# Patient Record
Sex: Female | Born: 1935
Health system: Southern US, Community
[De-identification: ages and names within clinical notes are randomized; demographics above are authoritative.]

## PROBLEM LIST (undated history)

## (undated) DIAGNOSIS — I2089 Other forms of angina pectoris: Secondary | ICD-10-CM

## (undated) DIAGNOSIS — K2100 Gastro-esophageal reflux disease with esophagitis, without bleeding: Secondary | ICD-10-CM

## (undated) DIAGNOSIS — I208 Other forms of angina pectoris: Secondary | ICD-10-CM

## (undated) DIAGNOSIS — B059 Measles without complication: Secondary | ICD-10-CM

## (undated) DIAGNOSIS — E785 Hyperlipidemia, unspecified: Secondary | ICD-10-CM

## (undated) DIAGNOSIS — I10 Essential (primary) hypertension: Secondary | ICD-10-CM

## (undated) HISTORY — DX: Essential (primary) hypertension: I10

## (undated) HISTORY — DX: Measles without complication: B05.9

## (undated) HISTORY — DX: Gastro-esophageal reflux disease with esophagitis, without bleeding: K21.00

## (undated) HISTORY — DX: Other forms of angina pectoris: I20.89

## (undated) HISTORY — PX: LAPAROSCOPIC TOTAL HYSTERECTOMY: SUR800

## (undated) HISTORY — DX: Other forms of angina pectoris: I20.8

## (undated) HISTORY — DX: Hyperlipidemia, unspecified: E78.5

## (undated) HISTORY — DX: Gastro-esophageal reflux disease with esophagitis: K21.0

---

## 2005-02-15 HISTORY — PX: DIAGNOSTIC MAMMOGRAM: HXRAD719

## 2007-01-02 ENCOUNTER — Ambulatory Visit: Payer: Self-pay | Admitting: Cardiology

## 2009-04-08 ENCOUNTER — Ambulatory Visit: Payer: Self-pay | Admitting: Cardiology

## 2012-08-15 HISTORY — PX: COLONOSCOPY: SHX174

## 2015-02-16 HISTORY — PX: OTHER SURGICAL HISTORY: SHX169

## 2015-03-17 DIAGNOSIS — Z888 Allergy status to other drugs, medicaments and biological substances status: Secondary | ICD-10-CM | POA: Diagnosis not present

## 2015-03-17 DIAGNOSIS — Z87440 Personal history of urinary (tract) infections: Secondary | ICD-10-CM | POA: Diagnosis not present

## 2015-03-17 DIAGNOSIS — Z86010 Personal history of colonic polyps: Secondary | ICD-10-CM | POA: Diagnosis not present

## 2015-03-17 DIAGNOSIS — I10 Essential (primary) hypertension: Secondary | ICD-10-CM | POA: Diagnosis not present

## 2015-03-17 DIAGNOSIS — D62 Acute posthemorrhagic anemia: Secondary | ICD-10-CM | POA: Diagnosis not present

## 2015-03-17 DIAGNOSIS — E7800 Pure hypercholesterolemia, unspecified: Secondary | ICD-10-CM | POA: Diagnosis not present

## 2015-03-17 DIAGNOSIS — Z90710 Acquired absence of both cervix and uterus: Secondary | ICD-10-CM | POA: Diagnosis not present

## 2015-03-17 DIAGNOSIS — Z7982 Long term (current) use of aspirin: Secondary | ICD-10-CM | POA: Diagnosis not present

## 2015-03-17 DIAGNOSIS — M1990 Unspecified osteoarthritis, unspecified site: Secondary | ICD-10-CM | POA: Diagnosis not present

## 2015-03-17 DIAGNOSIS — R350 Frequency of micturition: Secondary | ICD-10-CM | POA: Diagnosis not present

## 2015-03-17 DIAGNOSIS — N8111 Cystocele, midline: Secondary | ICD-10-CM | POA: Diagnosis not present

## 2015-03-17 DIAGNOSIS — Z79899 Other long term (current) drug therapy: Secondary | ICD-10-CM | POA: Diagnosis not present

## 2015-03-17 DIAGNOSIS — M8580 Other specified disorders of bone density and structure, unspecified site: Secondary | ICD-10-CM | POA: Diagnosis not present

## 2015-03-18 DIAGNOSIS — I10 Essential (primary) hypertension: Secondary | ICD-10-CM | POA: Diagnosis not present

## 2015-03-18 DIAGNOSIS — E7800 Pure hypercholesterolemia, unspecified: Secondary | ICD-10-CM | POA: Diagnosis not present

## 2015-03-18 DIAGNOSIS — Z90710 Acquired absence of both cervix and uterus: Secondary | ICD-10-CM | POA: Diagnosis not present

## 2015-03-18 DIAGNOSIS — D62 Acute posthemorrhagic anemia: Secondary | ICD-10-CM | POA: Diagnosis not present

## 2015-03-18 DIAGNOSIS — R350 Frequency of micturition: Secondary | ICD-10-CM | POA: Diagnosis not present

## 2015-03-18 DIAGNOSIS — Z888 Allergy status to other drugs, medicaments and biological substances status: Secondary | ICD-10-CM | POA: Diagnosis not present

## 2015-03-18 DIAGNOSIS — Z87440 Personal history of urinary (tract) infections: Secondary | ICD-10-CM | POA: Diagnosis not present

## 2015-03-18 DIAGNOSIS — N8111 Cystocele, midline: Secondary | ICD-10-CM | POA: Diagnosis not present

## 2015-03-18 DIAGNOSIS — N8110 Cystocele, unspecified: Secondary | ICD-10-CM | POA: Diagnosis not present

## 2015-03-18 DIAGNOSIS — Z79899 Other long term (current) drug therapy: Secondary | ICD-10-CM | POA: Diagnosis not present

## 2015-03-18 DIAGNOSIS — Z7982 Long term (current) use of aspirin: Secondary | ICD-10-CM | POA: Diagnosis not present

## 2015-03-18 DIAGNOSIS — M1990 Unspecified osteoarthritis, unspecified site: Secondary | ICD-10-CM | POA: Diagnosis not present

## 2015-03-18 DIAGNOSIS — M8580 Other specified disorders of bone density and structure, unspecified site: Secondary | ICD-10-CM | POA: Diagnosis not present

## 2015-03-18 DIAGNOSIS — Z86010 Personal history of colonic polyps: Secondary | ICD-10-CM | POA: Diagnosis not present

## 2015-03-19 DIAGNOSIS — Z79899 Other long term (current) drug therapy: Secondary | ICD-10-CM | POA: Diagnosis not present

## 2015-03-19 DIAGNOSIS — I10 Essential (primary) hypertension: Secondary | ICD-10-CM | POA: Diagnosis not present

## 2015-03-19 DIAGNOSIS — Z86010 Personal history of colonic polyps: Secondary | ICD-10-CM | POA: Diagnosis not present

## 2015-03-19 DIAGNOSIS — M8580 Other specified disorders of bone density and structure, unspecified site: Secondary | ICD-10-CM | POA: Diagnosis not present

## 2015-03-19 DIAGNOSIS — Z888 Allergy status to other drugs, medicaments and biological substances status: Secondary | ICD-10-CM | POA: Diagnosis not present

## 2015-03-19 DIAGNOSIS — M1990 Unspecified osteoarthritis, unspecified site: Secondary | ICD-10-CM | POA: Diagnosis not present

## 2015-03-19 DIAGNOSIS — Z7982 Long term (current) use of aspirin: Secondary | ICD-10-CM | POA: Diagnosis not present

## 2015-03-19 DIAGNOSIS — R350 Frequency of micturition: Secondary | ICD-10-CM | POA: Diagnosis not present

## 2015-03-19 DIAGNOSIS — E7800 Pure hypercholesterolemia, unspecified: Secondary | ICD-10-CM | POA: Diagnosis not present

## 2015-03-19 DIAGNOSIS — D62 Acute posthemorrhagic anemia: Secondary | ICD-10-CM | POA: Diagnosis not present

## 2015-03-19 DIAGNOSIS — Z87440 Personal history of urinary (tract) infections: Secondary | ICD-10-CM | POA: Diagnosis not present

## 2015-03-19 DIAGNOSIS — N8111 Cystocele, midline: Secondary | ICD-10-CM | POA: Diagnosis not present

## 2015-03-27 DIAGNOSIS — Z7982 Long term (current) use of aspirin: Secondary | ICD-10-CM | POA: Diagnosis not present

## 2015-03-27 DIAGNOSIS — M810 Age-related osteoporosis without current pathological fracture: Secondary | ICD-10-CM | POA: Diagnosis not present

## 2015-03-27 DIAGNOSIS — E7800 Pure hypercholesterolemia, unspecified: Secondary | ICD-10-CM | POA: Diagnosis not present

## 2015-03-27 DIAGNOSIS — Z13820 Encounter for screening for osteoporosis: Secondary | ICD-10-CM | POA: Diagnosis not present

## 2015-03-27 DIAGNOSIS — Z780 Asymptomatic menopausal state: Secondary | ICD-10-CM | POA: Diagnosis not present

## 2015-03-27 DIAGNOSIS — I10 Essential (primary) hypertension: Secondary | ICD-10-CM | POA: Diagnosis not present

## 2015-03-27 DIAGNOSIS — Z79899 Other long term (current) drug therapy: Secondary | ICD-10-CM | POA: Diagnosis not present

## 2015-05-13 DIAGNOSIS — Z0000 Encounter for general adult medical examination without abnormal findings: Secondary | ICD-10-CM | POA: Diagnosis not present

## 2015-05-13 DIAGNOSIS — I10 Essential (primary) hypertension: Secondary | ICD-10-CM | POA: Diagnosis not present

## 2015-05-30 DIAGNOSIS — R05 Cough: Secondary | ICD-10-CM | POA: Diagnosis not present

## 2015-05-30 DIAGNOSIS — M1990 Unspecified osteoarthritis, unspecified site: Secondary | ICD-10-CM | POA: Diagnosis not present

## 2015-05-30 DIAGNOSIS — Z7982 Long term (current) use of aspirin: Secondary | ICD-10-CM | POA: Diagnosis not present

## 2015-05-30 DIAGNOSIS — R6884 Jaw pain: Secondary | ICD-10-CM | POA: Diagnosis not present

## 2015-05-30 DIAGNOSIS — Z8249 Family history of ischemic heart disease and other diseases of the circulatory system: Secondary | ICD-10-CM | POA: Diagnosis not present

## 2015-05-30 DIAGNOSIS — I10 Essential (primary) hypertension: Secondary | ICD-10-CM | POA: Diagnosis not present

## 2015-05-30 DIAGNOSIS — J069 Acute upper respiratory infection, unspecified: Secondary | ICD-10-CM | POA: Diagnosis not present

## 2015-05-30 DIAGNOSIS — Z79899 Other long term (current) drug therapy: Secondary | ICD-10-CM | POA: Diagnosis not present

## 2015-07-10 DIAGNOSIS — H52223 Regular astigmatism, bilateral: Secondary | ICD-10-CM | POA: Diagnosis not present

## 2015-07-10 DIAGNOSIS — H5203 Hypermetropia, bilateral: Secondary | ICD-10-CM | POA: Diagnosis not present

## 2015-07-10 DIAGNOSIS — H25813 Combined forms of age-related cataract, bilateral: Secondary | ICD-10-CM | POA: Diagnosis not present

## 2015-07-10 DIAGNOSIS — H524 Presbyopia: Secondary | ICD-10-CM | POA: Diagnosis not present

## 2015-07-21 DIAGNOSIS — R3915 Urgency of urination: Secondary | ICD-10-CM | POA: Diagnosis not present

## 2015-07-21 DIAGNOSIS — N8110 Cystocele, unspecified: Secondary | ICD-10-CM | POA: Diagnosis not present

## 2015-08-14 DIAGNOSIS — M818 Other osteoporosis without current pathological fracture: Secondary | ICD-10-CM | POA: Diagnosis not present

## 2015-08-14 DIAGNOSIS — Z1389 Encounter for screening for other disorder: Secondary | ICD-10-CM | POA: Diagnosis not present

## 2015-08-14 DIAGNOSIS — I10 Essential (primary) hypertension: Secondary | ICD-10-CM | POA: Diagnosis not present

## 2015-08-14 DIAGNOSIS — E1165 Type 2 diabetes mellitus with hyperglycemia: Secondary | ICD-10-CM | POA: Diagnosis not present

## 2015-08-14 DIAGNOSIS — Z0000 Encounter for general adult medical examination without abnormal findings: Secondary | ICD-10-CM | POA: Diagnosis not present

## 2015-10-14 DIAGNOSIS — I10 Essential (primary) hypertension: Secondary | ICD-10-CM | POA: Diagnosis not present

## 2015-10-14 DIAGNOSIS — M818 Other osteoporosis without current pathological fracture: Secondary | ICD-10-CM | POA: Diagnosis not present

## 2015-10-14 DIAGNOSIS — E1165 Type 2 diabetes mellitus with hyperglycemia: Secondary | ICD-10-CM | POA: Diagnosis not present

## 2015-11-14 DIAGNOSIS — I10 Essential (primary) hypertension: Secondary | ICD-10-CM | POA: Diagnosis not present

## 2015-11-14 DIAGNOSIS — E1165 Type 2 diabetes mellitus with hyperglycemia: Secondary | ICD-10-CM | POA: Diagnosis not present

## 2015-11-14 DIAGNOSIS — M818 Other osteoporosis without current pathological fracture: Secondary | ICD-10-CM | POA: Diagnosis not present

## 2015-11-19 DIAGNOSIS — E1165 Type 2 diabetes mellitus with hyperglycemia: Secondary | ICD-10-CM | POA: Diagnosis not present

## 2015-11-19 DIAGNOSIS — I10 Essential (primary) hypertension: Secondary | ICD-10-CM | POA: Diagnosis not present

## 2015-11-28 DIAGNOSIS — E119 Type 2 diabetes mellitus without complications: Secondary | ICD-10-CM | POA: Diagnosis not present

## 2015-11-28 DIAGNOSIS — I10 Essential (primary) hypertension: Secondary | ICD-10-CM | POA: Diagnosis not present

## 2015-11-28 DIAGNOSIS — E785 Hyperlipidemia, unspecified: Secondary | ICD-10-CM | POA: Diagnosis not present

## 2015-11-28 DIAGNOSIS — Z7982 Long term (current) use of aspirin: Secondary | ICD-10-CM | POA: Diagnosis not present

## 2015-11-28 DIAGNOSIS — Z1211 Encounter for screening for malignant neoplasm of colon: Secondary | ICD-10-CM | POA: Diagnosis not present

## 2015-11-28 DIAGNOSIS — Z86010 Personal history of colonic polyps: Secondary | ICD-10-CM | POA: Diagnosis not present

## 2015-11-28 DIAGNOSIS — Z888 Allergy status to other drugs, medicaments and biological substances status: Secondary | ICD-10-CM | POA: Diagnosis not present

## 2015-11-28 DIAGNOSIS — Z87440 Personal history of urinary (tract) infections: Secondary | ICD-10-CM | POA: Diagnosis not present

## 2015-11-28 DIAGNOSIS — Z7984 Long term (current) use of oral hypoglycemic drugs: Secondary | ICD-10-CM | POA: Diagnosis not present

## 2015-11-28 DIAGNOSIS — M1990 Unspecified osteoarthritis, unspecified site: Secondary | ICD-10-CM | POA: Diagnosis not present

## 2015-11-28 DIAGNOSIS — Z79899 Other long term (current) drug therapy: Secondary | ICD-10-CM | POA: Diagnosis not present

## 2015-12-18 DIAGNOSIS — I10 Essential (primary) hypertension: Secondary | ICD-10-CM | POA: Diagnosis not present

## 2015-12-18 DIAGNOSIS — E1165 Type 2 diabetes mellitus with hyperglycemia: Secondary | ICD-10-CM | POA: Diagnosis not present

## 2015-12-18 DIAGNOSIS — Z23 Encounter for immunization: Secondary | ICD-10-CM | POA: Diagnosis not present

## 2016-01-16 DIAGNOSIS — M818 Other osteoporosis without current pathological fracture: Secondary | ICD-10-CM | POA: Diagnosis not present

## 2016-01-16 DIAGNOSIS — I10 Essential (primary) hypertension: Secondary | ICD-10-CM | POA: Diagnosis not present

## 2016-01-16 DIAGNOSIS — E1165 Type 2 diabetes mellitus with hyperglycemia: Secondary | ICD-10-CM | POA: Diagnosis not present

## 2016-01-16 DIAGNOSIS — J3089 Other allergic rhinitis: Secondary | ICD-10-CM | POA: Diagnosis not present

## 2016-02-25 DIAGNOSIS — I10 Essential (primary) hypertension: Secondary | ICD-10-CM | POA: Diagnosis not present

## 2016-02-25 DIAGNOSIS — E1165 Type 2 diabetes mellitus with hyperglycemia: Secondary | ICD-10-CM | POA: Diagnosis not present

## 2016-04-16 DIAGNOSIS — Z6829 Body mass index (BMI) 29.0-29.9, adult: Secondary | ICD-10-CM | POA: Diagnosis not present

## 2016-04-16 DIAGNOSIS — I10 Essential (primary) hypertension: Secondary | ICD-10-CM | POA: Diagnosis not present

## 2016-04-16 DIAGNOSIS — E1165 Type 2 diabetes mellitus with hyperglycemia: Secondary | ICD-10-CM | POA: Diagnosis not present

## 2016-04-16 DIAGNOSIS — J3089 Other allergic rhinitis: Secondary | ICD-10-CM | POA: Diagnosis not present

## 2016-04-16 DIAGNOSIS — M818 Other osteoporosis without current pathological fracture: Secondary | ICD-10-CM | POA: Diagnosis not present

## 2016-05-19 DIAGNOSIS — E119 Type 2 diabetes mellitus without complications: Secondary | ICD-10-CM | POA: Diagnosis not present

## 2016-05-19 DIAGNOSIS — E7800 Pure hypercholesterolemia, unspecified: Secondary | ICD-10-CM | POA: Diagnosis not present

## 2016-05-19 DIAGNOSIS — R079 Chest pain, unspecified: Secondary | ICD-10-CM | POA: Diagnosis not present

## 2016-05-19 DIAGNOSIS — Z79899 Other long term (current) drug therapy: Secondary | ICD-10-CM | POA: Diagnosis not present

## 2016-05-19 DIAGNOSIS — Z7982 Long term (current) use of aspirin: Secondary | ICD-10-CM | POA: Diagnosis not present

## 2016-05-19 DIAGNOSIS — I10 Essential (primary) hypertension: Secondary | ICD-10-CM | POA: Diagnosis not present

## 2016-05-19 DIAGNOSIS — Z7984 Long term (current) use of oral hypoglycemic drugs: Secondary | ICD-10-CM | POA: Diagnosis not present

## 2016-05-19 DIAGNOSIS — R0789 Other chest pain: Secondary | ICD-10-CM | POA: Diagnosis not present

## 2016-05-21 DIAGNOSIS — I208 Other forms of angina pectoris: Secondary | ICD-10-CM | POA: Diagnosis not present

## 2016-05-21 DIAGNOSIS — Z6829 Body mass index (BMI) 29.0-29.9, adult: Secondary | ICD-10-CM | POA: Diagnosis not present

## 2016-05-21 DIAGNOSIS — K210 Gastro-esophageal reflux disease with esophagitis: Secondary | ICD-10-CM | POA: Diagnosis not present

## 2016-05-27 DIAGNOSIS — I208 Other forms of angina pectoris: Secondary | ICD-10-CM | POA: Diagnosis not present

## 2016-06-03 DIAGNOSIS — M19019 Primary osteoarthritis, unspecified shoulder: Secondary | ICD-10-CM | POA: Diagnosis not present

## 2016-06-03 DIAGNOSIS — I10 Essential (primary) hypertension: Secondary | ICD-10-CM | POA: Diagnosis not present

## 2016-06-03 DIAGNOSIS — Z7982 Long term (current) use of aspirin: Secondary | ICD-10-CM | POA: Diagnosis not present

## 2016-06-03 DIAGNOSIS — K219 Gastro-esophageal reflux disease without esophagitis: Secondary | ICD-10-CM | POA: Diagnosis not present

## 2016-06-03 DIAGNOSIS — Z7984 Long term (current) use of oral hypoglycemic drugs: Secondary | ICD-10-CM | POA: Diagnosis not present

## 2016-06-03 DIAGNOSIS — Z6830 Body mass index (BMI) 30.0-30.9, adult: Secondary | ICD-10-CM | POA: Diagnosis not present

## 2016-06-03 DIAGNOSIS — E785 Hyperlipidemia, unspecified: Secondary | ICD-10-CM | POA: Diagnosis not present

## 2016-06-03 DIAGNOSIS — R32 Unspecified urinary incontinence: Secondary | ICD-10-CM | POA: Diagnosis not present

## 2016-06-03 DIAGNOSIS — E119 Type 2 diabetes mellitus without complications: Secondary | ICD-10-CM | POA: Diagnosis not present

## 2016-06-03 DIAGNOSIS — Z0000 Encounter for general adult medical examination without abnormal findings: Secondary | ICD-10-CM | POA: Diagnosis not present

## 2016-06-09 DIAGNOSIS — I10 Essential (primary) hypertension: Secondary | ICD-10-CM | POA: Diagnosis not present

## 2016-06-09 DIAGNOSIS — E1165 Type 2 diabetes mellitus with hyperglycemia: Secondary | ICD-10-CM | POA: Diagnosis not present

## 2016-06-18 ENCOUNTER — Encounter: Payer: Self-pay | Admitting: *Deleted

## 2016-06-21 ENCOUNTER — Telehealth: Payer: Self-pay | Admitting: Cardiology

## 2016-06-21 ENCOUNTER — Encounter: Payer: Self-pay | Admitting: Cardiology

## 2016-06-21 ENCOUNTER — Ambulatory Visit (INDEPENDENT_AMBULATORY_CARE_PROVIDER_SITE_OTHER): Payer: Medicare HMO | Admitting: Cardiology

## 2016-06-21 VITALS — BP 117/78 | HR 64 | Ht 60.0 in | Wt 151.0 lb

## 2016-06-21 DIAGNOSIS — I493 Ventricular premature depolarization: Secondary | ICD-10-CM | POA: Diagnosis not present

## 2016-06-21 DIAGNOSIS — R079 Chest pain, unspecified: Secondary | ICD-10-CM | POA: Diagnosis not present

## 2016-06-21 NOTE — Patient Instructions (Signed)
Your physician recommends that you schedule a follow-up appointment in: TO BE DETERMINED AFTER TESTING   Your physician recommends that you continue on your current medications as directed. Please refer to the Current Medication list given to you today.  Your physician has requested that you have an echocardiogram. Echocardiography is a painless test that uses sound waves to create images of your heart. It provides your doctor with information about the size and shape of your heart and how well your heart's chambers and valves are working. This procedure takes approximately one hour. There are no restrictions for this procedure.  Thank you for choosing [facility]!!    

## 2016-06-21 NOTE — Progress Notes (Signed)
Clinical Summary Ms. Tourangeau is a 83 y.o.female seen as new patient,she is referred by Dr Horris Latino for the following medical problems.   1. Chest pain - chest pain started a few weeks ago. Soreness midchest, 5/10 in severity. Mainly occurred with activity. No other associated symptoms. Last just a few minutes, could reoccur - seen in [facility] ER 05/19/16 with chest pain - trop neg x 2, neg D-dimer. CXR no acute process, EKG NSR without ischemic changes - 05/27/16 GXT went 2 min 39 sec, reached THR, no ST/T changes but multiple PVCs. Intermediate risk.  - denies any palpitations.  Past Medical History:  Diagnosis Date  . Gastroesophageal reflux disease with esophagitis   . Hyperlipidemia   . Hypertension   . Measles   . Other forms of angina pectoris (HCC)      Allergies  Allergen Reactions  . Ace Inhibitors      Current Outpatient Prescriptions  Medication Sig Dispense Refill  . alendronate (FOSAMAX) 70 MG tablet Take 70 mg by mouth once a week. Take with a full glass of water on an empty stomach.    Marland Kitchen amLODipine (NORVASC) 10 MG tablet Take 10 mg by mouth daily.    Marland Kitchen atorvastatin (LIPITOR) 20 MG tablet Take 20 mg by mouth daily.    . cetirizine (ZYRTEC) 10 MG tablet Take 10 mg by mouth daily.    . Cholecalciferol (VITAMIN D3) 1000 units CAPS Take by mouth.    . furosemide (LASIX) 20 MG tablet Take 20 mg by mouth daily.    . Glucosamine 500 MG CAPS Take by mouth daily.    . metFORMIN (GLUCOPHAGE) 500 MG tablet Take 500 mg by mouth daily with breakfast.    . Omega-3 Fatty Acids (FISH OIL) 1000 MG CAPS Take by mouth daily.    . ranitidine (ZANTAC) 300 MG tablet Take 300 mg by mouth at bedtime.     No current facility-administered medications for this visit.      Past Surgical History:  Procedure Laterality Date  . BLADDER TACT  02/2015  . COLONOSCOPY  08/2012  . DIAGNOSTIC MAMMOGRAM  2007  . LAPAROSCOPIC TOTAL HYSTERECTOMY       Allergies  Allergen Reactions  .  Ace Inhibitors       Family History  Problem Relation Age of Onset  . Appendicitis Mother   . Other Father   . Multiple sclerosis Daughter      Social History Ms. Alberty has no tobacco history on file. Ms. Tandy has no alcohol history on file.   Review of Systems CONSTITUTIONAL: No weight loss, fever, chills, weakness or fatigue.  HEENT: Eyes: No visual loss, blurred vision, double vision or yellow sclerae.No hearing loss, sneezing, congestion, runny nose or sore throat.  SKIN: No rash or itching.  CARDIOVASCULAR: per hpi RESPIRATORY: per hpi GASTROINTESTINAL: No anorexia, nausea, vomiting or diarrhea. No abdominal pain or blood.  GENITOURINARY: No burning on urination, no polyuria NEUROLOGICAL: No headache, dizziness, syncope, paralysis, ataxia, numbness or tingling in the extremities. No change in bowel or bladder control.  MUSCULOSKELETAL: No muscle, back pain, joint pain or stiffness.  LYMPHATICS: No enlarged nodes. No history of splenectomy.  PSYCHIATRIC: No history of depression or anxiety.  ENDOCRINOLOGIC: No reports of sweating, cold or heat intolerance. No polyuria or polydipsia.  Marland Kitchen   Physical Examination Vitals:   06/21/16 0918 06/21/16 0924  BP: 110/74 117/78  Pulse: 64 64   Vitals:   06/21/16 0918  Weight:  151 lb (68.5 kg)  Height: 5' (1.524 m)    Gen: resting comfortably, no acute distress HEENT: no scleral icterus, pupils equal round and reactive, no palptable cervical adenopathy,  CV: RRR, no m/r/g, no jvd Resp: Clear to auscultation bilaterally GI: abdomen is soft, non-tender, non-distended, normal bowel sounds, no hepatosplenomegaly MSK: extremities are warm, no edema.  Skin: warm, no rash Neuro:  no focal deficits Psych: appropriate affect    Assessment and Plan   1. Chest pain - recent GXT with multiple exericse induced PVCs. Reported as intermediate risk - we will obtain echo to evaluate for structural heart disease.  - pending echo,  likely obtain stress imaging to better evaluate for possible ischemic heart disease in setting of chest pain and exercise induced PVCs  2. PVCs - obtain echo, pending results pursue ischemic testing.   F/u pending test results    Antoine PocheJonathan F. Branch, M.D.

## 2016-06-21 NOTE — Telephone Encounter (Signed)
Echo [place] may 30 in [place]

## 2016-07-14 ENCOUNTER — Ambulatory Visit (INDEPENDENT_AMBULATORY_CARE_PROVIDER_SITE_OTHER): Payer: Medicare HMO

## 2016-07-14 ENCOUNTER — Other Ambulatory Visit: Payer: Self-pay

## 2016-07-14 DIAGNOSIS — R079 Chest pain, unspecified: Secondary | ICD-10-CM

## 2016-07-15 DIAGNOSIS — E1165 Type 2 diabetes mellitus with hyperglycemia: Secondary | ICD-10-CM | POA: Diagnosis not present

## 2016-07-15 DIAGNOSIS — I10 Essential (primary) hypertension: Secondary | ICD-10-CM | POA: Diagnosis not present

## 2016-07-19 DIAGNOSIS — I10 Essential (primary) hypertension: Secondary | ICD-10-CM | POA: Diagnosis not present

## 2016-07-19 DIAGNOSIS — E119 Type 2 diabetes mellitus without complications: Secondary | ICD-10-CM | POA: Diagnosis not present

## 2016-07-19 DIAGNOSIS — K210 Gastro-esophageal reflux disease with esophagitis: Secondary | ICD-10-CM | POA: Diagnosis not present

## 2016-07-19 DIAGNOSIS — Z79899 Other long term (current) drug therapy: Secondary | ICD-10-CM | POA: Diagnosis not present

## 2016-07-26 ENCOUNTER — Encounter: Payer: Self-pay | Admitting: *Deleted

## 2016-07-26 ENCOUNTER — Telehealth: Payer: Self-pay | Admitting: Cardiology

## 2016-07-26 ENCOUNTER — Telehealth: Payer: Self-pay | Admitting: *Deleted

## 2016-07-26 DIAGNOSIS — I493 Ventricular premature depolarization: Secondary | ICD-10-CM

## 2016-07-26 NOTE — Telephone Encounter (Signed)
-----   Message from Antoine PocheJonathan F Branch, MD sent at 07/21/2016 10:55 AM EDT ----- Echo overall looks good. Can we obtain a lexiscan stress test for her to evaluate for any blockages that may be causing her frequent extra heart beats  Dominga FerryJ Branch MD

## 2016-07-26 NOTE — Telephone Encounter (Signed)
Pt agreeable to stress test - will place orders and forward to schedulers - routed to pcp  

## 2016-07-26 NOTE — Telephone Encounter (Signed)
[facility] 2016 Arrive at 10:15am

## 2016-08-10 ENCOUNTER — Encounter (HOSPITAL_COMMUNITY)
Admission: RE | Admit: 2016-08-10 | Discharge: 2016-08-10 | Disposition: A | Discharge status: Home / Self Care | Payer: Medicare HMO | Source: Ambulatory Visit | Attending: Cardiology | Admitting: Cardiology

## 2016-08-10 ENCOUNTER — Encounter (HOSPITAL_COMMUNITY): Payer: Self-pay

## 2016-08-10 ENCOUNTER — Encounter (HOSPITAL_BASED_OUTPATIENT_CLINIC_OR_DEPARTMENT_OTHER)
Admission: RE | Admit: 2016-08-10 | Discharge: 2016-08-10 | Disposition: A | Discharge status: Home / Self Care | Payer: Medicare HMO | Source: Ambulatory Visit | Attending: Cardiology | Admitting: Cardiology

## 2016-08-10 DIAGNOSIS — I493 Ventricular premature depolarization: Secondary | ICD-10-CM

## 2016-08-10 LAB — NM MYOCAR MULTI W/SPECT W/WALL MOTION / EF
LV dias vol: 68 mL (ref 46–106)
LV sys vol: 20 mL
Peak HR: 95 {beats}/min
RATE: 0.33
Rest HR: 58 {beats}/min
SDS: 3
SRS: 0
SSS: 3
TID: 0.94

## 2016-08-10 MED ORDER — REGADENOSON 0.4 MG/5ML IV SOLN
INTRAVENOUS | Status: AC
  Administered 2016-08-10: 0.4 mg via INTRAVENOUS
  Filled 2016-08-10: qty 5

## 2016-08-10 MED ORDER — TECHNETIUM TC 99M TETROFOSMIN IV KIT
10.0000 | PACK | Freq: Once | INTRAVENOUS | Status: AC | PRN
  Administered 2016-08-10: 11 via INTRAVENOUS

## 2016-08-10 MED ORDER — SODIUM CHLORIDE 0.9% FLUSH
INTRAVENOUS | Status: AC
  Administered 2016-08-10: 10 mL via INTRAVENOUS
  Filled 2016-08-10: qty 10

## 2016-08-10 MED ORDER — TECHNETIUM TC 99M TETROFOSMIN IV KIT
30.0000 | PACK | Freq: Once | INTRAVENOUS | Status: AC | PRN
  Administered 2016-08-10: 30 via INTRAVENOUS

## 2016-08-11 ENCOUNTER — Telehealth: Payer: Self-pay | Admitting: *Deleted

## 2016-08-11 NOTE — Telephone Encounter (Signed)
Pt aware - appt scheduled next available 8/31 - routed to pcp

## 2016-08-11 NOTE — Telephone Encounter (Signed)
-----   Message from Antoine PocheJonathan F Branch, MD sent at 08/11/2016  1:41 PM EDT ----- Stress test looks good, no evidence of blockages. F/u 1 month to discuss in detail and reevaluate symptoms  Dominga FerryJ Branch MD

## 2016-09-01 DIAGNOSIS — M7542 Impingement syndrome of left shoulder: Secondary | ICD-10-CM | POA: Diagnosis not present

## 2016-09-01 DIAGNOSIS — M25512 Pain in left shoulder: Secondary | ICD-10-CM | POA: Diagnosis not present

## 2016-09-01 DIAGNOSIS — S46812A Strain of other muscles, fascia and tendons at shoulder and upper arm level, left arm, initial encounter: Secondary | ICD-10-CM | POA: Diagnosis not present

## 2016-09-04 DIAGNOSIS — S134XXA Sprain of ligaments of cervical spine, initial encounter: Secondary | ICD-10-CM | POA: Diagnosis not present

## 2016-09-04 DIAGNOSIS — S46811A Strain of other muscles, fascia and tendons at shoulder and upper arm level, right arm, initial encounter: Secondary | ICD-10-CM | POA: Diagnosis not present

## 2016-09-04 DIAGNOSIS — M5126 Other intervertebral disc displacement, lumbar region: Secondary | ICD-10-CM | POA: Diagnosis not present

## 2016-09-04 DIAGNOSIS — M19011 Primary osteoarthritis, right shoulder: Secondary | ICD-10-CM | POA: Diagnosis not present

## 2016-09-04 DIAGNOSIS — M7541 Impingement syndrome of right shoulder: Secondary | ICD-10-CM | POA: Diagnosis not present

## 2016-09-05 DIAGNOSIS — S46811A Strain of other muscles, fascia and tendons at shoulder and upper arm level, right arm, initial encounter: Secondary | ICD-10-CM | POA: Diagnosis not present

## 2016-09-05 DIAGNOSIS — S134XXA Sprain of ligaments of cervical spine, initial encounter: Secondary | ICD-10-CM | POA: Diagnosis not present

## 2016-09-05 DIAGNOSIS — M7541 Impingement syndrome of right shoulder: Secondary | ICD-10-CM | POA: Diagnosis not present

## 2016-09-05 DIAGNOSIS — M19011 Primary osteoarthritis, right shoulder: Secondary | ICD-10-CM | POA: Diagnosis not present

## 2016-09-08 DIAGNOSIS — M47896 Other spondylosis, lumbar region: Secondary | ICD-10-CM | POA: Diagnosis not present

## 2016-09-08 DIAGNOSIS — M532X7 Spinal instabilities, lumbosacral region: Secondary | ICD-10-CM | POA: Diagnosis not present

## 2016-09-08 DIAGNOSIS — M545 Low back pain: Secondary | ICD-10-CM | POA: Diagnosis not present

## 2016-09-08 DIAGNOSIS — S335XXA Sprain of ligaments of lumbar spine, initial encounter: Secondary | ICD-10-CM | POA: Diagnosis not present

## 2016-09-08 DIAGNOSIS — M47816 Spondylosis without myelopathy or radiculopathy, lumbar region: Secondary | ICD-10-CM | POA: Diagnosis not present

## 2016-09-22 DIAGNOSIS — M7542 Impingement syndrome of left shoulder: Secondary | ICD-10-CM | POA: Diagnosis not present

## 2016-09-22 DIAGNOSIS — M19012 Primary osteoarthritis, left shoulder: Secondary | ICD-10-CM | POA: Diagnosis not present

## 2016-09-22 DIAGNOSIS — S134XXA Sprain of ligaments of cervical spine, initial encounter: Secondary | ICD-10-CM | POA: Diagnosis not present

## 2016-09-22 DIAGNOSIS — S46812A Strain of other muscles, fascia and tendons at shoulder and upper arm level, left arm, initial encounter: Secondary | ICD-10-CM | POA: Diagnosis not present

## 2016-10-14 DIAGNOSIS — Z0100 Encounter for examination of eyes and vision without abnormal findings: Secondary | ICD-10-CM | POA: Diagnosis not present

## 2016-10-14 DIAGNOSIS — I10 Essential (primary) hypertension: Secondary | ICD-10-CM | POA: Diagnosis not present

## 2016-10-14 DIAGNOSIS — H25813 Combined forms of age-related cataract, bilateral: Secondary | ICD-10-CM | POA: Diagnosis not present

## 2016-10-15 ENCOUNTER — Encounter: Payer: Self-pay | Admitting: Cardiology

## 2016-10-15 ENCOUNTER — Ambulatory Visit (INDEPENDENT_AMBULATORY_CARE_PROVIDER_SITE_OTHER): Payer: Medicare HMO | Admitting: Cardiology

## 2016-10-15 VITALS — BP 114/64 | HR 67 | Ht 63.0 in | Wt 143.0 lb

## 2016-10-15 DIAGNOSIS — I493 Ventricular premature depolarization: Secondary | ICD-10-CM

## 2016-10-15 DIAGNOSIS — R0789 Other chest pain: Secondary | ICD-10-CM | POA: Diagnosis not present

## 2016-10-15 NOTE — Progress Notes (Signed)
Clinical Summary Ms. Lepage is a 83 y.o.female seen today for follow up of the following medical problems.   1. Chest pain - chest pain started a few weeks ago. Soreness midchest, 5/10 in severity. Mainly occurred with activity. No other associated symptoms. Last just a few minutes, could reoccur - seen in [facility] ER 05/19/16 with chest pain - trop neg x 2, neg D-dimer. CXR no acute process, EKG NSR without ischemic changes - 05/27/16 GXT went 2 min 39 sec, reached THR, no ST/T changes but multiple PVCs. Intermediate risk.  - denies any palpitations.    06/2016 echo LVEF 60-65%, no WMAs, grade I diastolic dysfunction 07/2016 nuclear stress test: no ischemia  - no recurrent chest pain since last visit   Past Medical History:  Diagnosis Date  . Gastroesophageal reflux disease with esophagitis   . Hyperlipidemia   . Hypertension   . Measles   . Other forms of angina pectoris (HCC)      Allergies  Allergen Reactions  . Ace Inhibitors      Current Outpatient Prescriptions  Medication Sig Dispense Refill  . amLODipine (NORVASC) 10 MG tablet Take 10 mg by mouth daily.    Marland Kitchen aspirin EC 81 MG tablet Take 81 mg by mouth daily.    Marland Kitchen atorvastatin (LIPITOR) 20 MG tablet Take 20 mg by mouth daily.    . cetirizine (ZYRTEC) 10 MG tablet Take 10 mg by mouth daily as needed.     . Cholecalciferol (VITAMIN D3) 1000 units CAPS Take by mouth.    . furosemide (LASIX) 20 MG tablet Take 20 mg by mouth daily.    . Glucosamine 500 MG CAPS Take by mouth daily.    . metFORMIN (GLUCOPHAGE) 500 MG tablet Take 500 mg by mouth daily with breakfast.    . Omega-3 Fatty Acids (FISH OIL) 1000 MG CAPS Take 1 capsule by mouth daily.     . ranitidine (ZANTAC) 300 MG tablet Take 300 mg by mouth at bedtime.    . solifenacin (VESICARE) 10 MG tablet Take 5 mg by mouth daily.     No current facility-administered medications for this visit.      Past Surgical History:  Procedure Laterality Date  . BLADDER  TACT  02/2015  . COLONOSCOPY  08/2012  . DIAGNOSTIC MAMMOGRAM  2007  . LAPAROSCOPIC TOTAL HYSTERECTOMY       Allergies  Allergen Reactions  . Ace Inhibitors       Family History  Problem Relation Age of Onset  . Appendicitis Mother   . Other Father   . Multiple sclerosis Daughter      Social History Ms. Hunt reports that she has never smoked. She has never used smokeless tobacco. Ms. Muska has no alcohol history on file.   Review of Systems CONSTITUTIONAL: No weight loss, fever, chills, weakness or fatigue.  HEENT: Eyes: No visual loss, blurred vision, double vision or yellow sclerae.No hearing loss, sneezing, congestion, runny nose or sore throat.  SKIN: No rash or itching.  CARDIOVASCULAR: per hpi RESPIRATORY: No shortness of breath, cough or sputum.  GASTROINTESTINAL: No anorexia, nausea, vomiting or diarrhea. No abdominal pain or blood.  GENITOURINARY: No burning on urination, no polyuria NEUROLOGICAL: No headache, dizziness, syncope, paralysis, ataxia, numbness or tingling in the extremities. No change in bowel or bladder control.  MUSCULOSKELETAL: No muscle, back pain, joint pain or stiffness.  LYMPHATICS: No enlarged nodes. No history of splenectomy.  PSYCHIATRIC: No history of depression or  anxiety.  ENDOCRINOLOGIC: No reports of sweating, cold or heat intolerance. No polyuria or polydipsia.  Marland Kitchen   Physical Examination Vitals:   10/15/16 1604  BP: 114/64  Pulse: 67  SpO2: 98%   Vitals:   10/15/16 1604  Weight: 143 lb (64.9 kg)  Height: 5\' 3"  (1.6 m)    Gen: resting comfortably, no acute distress HEENT: no scleral icterus, pupils equal round and reactive, no palptable cervical adenopathy,  CV: RRR, no m/rg, no jvd Resp: Clear to auscultation bilaterally GI: abdomen is soft, non-tender, non-distended, normal bowel sounds, no hepatosplenomegaly MSK: extremities are warm, no edema.  Skin: warm, no rash Neuro:  no focal deficits Psych: appropriate  affect   Diagnostic Studies  06/2016 echo Study Conclusions  - Left ventricle: The cavity size was normal. Systolic function was   normal. The estimated ejection fraction was in the range of 60%   to 65%. Wall motion was normal; there were no regional wall   motion abnormalities. Doppler parameters are consistent with   abnormal left ventricular relaxation (grade 1 diastolic   dysfunction). Mild focal basal septal hypertrophy. - Aorta: Very mild aortic root dilatation. - Mitral valve: There was mild regurgitation.   07/2016 Nuclear stress test  There was no ST segment deviation noted during stress.  The study is normal.  This is a low risk study.  The left ventricular ejection fraction is hyperdynamic (>65%).   Assessment and Plan   1. Chest pain - negative 07/2016 nuclear stress test - continue to monitor at this time.    2. PVCs - no evidence of underlying cardiac disease. Asymptomatic - continue to monitor      Antoine Poche, M.D.

## 2016-10-15 NOTE — Patient Instructions (Signed)
Medication Instructions:  Continue all current medications.  Labwork: none  Testing/Procedures: none  Follow-Up: As needed.    Any Other Special Instructions Will Be Listed Below (If Applicable).  If you need a refill on your cardiac medications before your next appointment, please call your pharmacy.  

## 2016-10-20 DIAGNOSIS — M170 Bilateral primary osteoarthritis of knee: Secondary | ICD-10-CM | POA: Diagnosis not present

## 2016-10-21 DIAGNOSIS — K210 Gastro-esophageal reflux disease with esophagitis: Secondary | ICD-10-CM | POA: Diagnosis not present

## 2016-10-21 DIAGNOSIS — Z6827 Body mass index (BMI) 27.0-27.9, adult: Secondary | ICD-10-CM | POA: Diagnosis not present

## 2016-10-21 DIAGNOSIS — I10 Essential (primary) hypertension: Secondary | ICD-10-CM | POA: Diagnosis not present

## 2016-10-21 DIAGNOSIS — E119 Type 2 diabetes mellitus without complications: Secondary | ICD-10-CM | POA: Diagnosis not present

## 2016-11-01 DIAGNOSIS — I10 Essential (primary) hypertension: Secondary | ICD-10-CM | POA: Diagnosis not present

## 2016-11-01 DIAGNOSIS — E1165 Type 2 diabetes mellitus with hyperglycemia: Secondary | ICD-10-CM | POA: Diagnosis not present

## 2016-11-24 DIAGNOSIS — R69 Illness, unspecified: Secondary | ICD-10-CM | POA: Diagnosis not present

## 2016-12-27 DIAGNOSIS — K210 Gastro-esophageal reflux disease with esophagitis: Secondary | ICD-10-CM | POA: Diagnosis not present

## 2016-12-27 DIAGNOSIS — E119 Type 2 diabetes mellitus without complications: Secondary | ICD-10-CM | POA: Diagnosis not present

## 2016-12-27 DIAGNOSIS — I10 Essential (primary) hypertension: Secondary | ICD-10-CM | POA: Diagnosis not present

## 2017-01-20 DIAGNOSIS — I10 Essential (primary) hypertension: Secondary | ICD-10-CM | POA: Diagnosis not present

## 2017-01-20 DIAGNOSIS — E7849 Other hyperlipidemia: Secondary | ICD-10-CM | POA: Diagnosis not present

## 2017-01-20 DIAGNOSIS — K210 Gastro-esophageal reflux disease with esophagitis: Secondary | ICD-10-CM | POA: Diagnosis not present

## 2017-01-20 DIAGNOSIS — Z6826 Body mass index (BMI) 26.0-26.9, adult: Secondary | ICD-10-CM | POA: Diagnosis not present

## 2017-01-20 DIAGNOSIS — E119 Type 2 diabetes mellitus without complications: Secondary | ICD-10-CM | POA: Diagnosis not present

## 2017-01-25 DIAGNOSIS — E7849 Other hyperlipidemia: Secondary | ICD-10-CM | POA: Diagnosis not present

## 2017-01-25 DIAGNOSIS — I10 Essential (primary) hypertension: Secondary | ICD-10-CM | POA: Diagnosis not present

## 2017-01-25 DIAGNOSIS — E119 Type 2 diabetes mellitus without complications: Secondary | ICD-10-CM | POA: Diagnosis not present

## 2017-01-25 DIAGNOSIS — K210 Gastro-esophageal reflux disease with esophagitis: Secondary | ICD-10-CM | POA: Diagnosis not present

## 2017-02-07 DIAGNOSIS — R69 Illness, unspecified: Secondary | ICD-10-CM | POA: Diagnosis not present

## 2017-03-31 DIAGNOSIS — M25572 Pain in left ankle and joints of left foot: Secondary | ICD-10-CM | POA: Diagnosis not present

## 2017-03-31 DIAGNOSIS — M1712 Unilateral primary osteoarthritis, left knee: Secondary | ICD-10-CM | POA: Diagnosis not present

## 2017-03-31 DIAGNOSIS — M25571 Pain in right ankle and joints of right foot: Secondary | ICD-10-CM | POA: Diagnosis not present

## 2017-03-31 DIAGNOSIS — M1711 Unilateral primary osteoarthritis, right knee: Secondary | ICD-10-CM | POA: Diagnosis not present

## 2017-04-07 DIAGNOSIS — M25321 Other instability, right elbow: Secondary | ICD-10-CM | POA: Diagnosis not present

## 2017-04-07 DIAGNOSIS — M25529 Pain in unspecified elbow: Secondary | ICD-10-CM | POA: Diagnosis not present

## 2017-04-07 DIAGNOSIS — M25531 Pain in right wrist: Secondary | ICD-10-CM | POA: Diagnosis not present

## 2017-04-25 DIAGNOSIS — Z6826 Body mass index (BMI) 26.0-26.9, adult: Secondary | ICD-10-CM | POA: Diagnosis not present

## 2017-04-25 DIAGNOSIS — E119 Type 2 diabetes mellitus without complications: Secondary | ICD-10-CM | POA: Diagnosis not present

## 2017-04-25 DIAGNOSIS — K210 Gastro-esophageal reflux disease with esophagitis: Secondary | ICD-10-CM | POA: Diagnosis not present

## 2017-04-25 DIAGNOSIS — I10 Essential (primary) hypertension: Secondary | ICD-10-CM | POA: Diagnosis not present

## 2017-04-25 DIAGNOSIS — E7849 Other hyperlipidemia: Secondary | ICD-10-CM | POA: Diagnosis not present

## 2017-05-03 DIAGNOSIS — M545 Low back pain: Secondary | ICD-10-CM | POA: Diagnosis not present

## 2017-05-03 DIAGNOSIS — M47816 Spondylosis without myelopathy or radiculopathy, lumbar region: Secondary | ICD-10-CM | POA: Diagnosis not present

## 2017-05-03 DIAGNOSIS — M532X7 Spinal instabilities, lumbosacral region: Secondary | ICD-10-CM | POA: Diagnosis not present

## 2017-05-03 DIAGNOSIS — M47896 Other spondylosis, lumbar region: Secondary | ICD-10-CM | POA: Diagnosis not present

## 2017-05-19 DIAGNOSIS — E1136 Type 2 diabetes mellitus with diabetic cataract: Secondary | ICD-10-CM | POA: Diagnosis not present

## 2017-05-19 DIAGNOSIS — G3184 Mild cognitive impairment, so stated: Secondary | ICD-10-CM | POA: Diagnosis not present

## 2017-05-19 DIAGNOSIS — I10 Essential (primary) hypertension: Secondary | ICD-10-CM | POA: Diagnosis not present

## 2017-05-19 DIAGNOSIS — K08409 Partial loss of teeth, unspecified cause, unspecified class: Secondary | ICD-10-CM | POA: Diagnosis not present

## 2017-05-19 DIAGNOSIS — K219 Gastro-esophageal reflux disease without esophagitis: Secondary | ICD-10-CM | POA: Diagnosis not present

## 2017-05-19 DIAGNOSIS — E785 Hyperlipidemia, unspecified: Secondary | ICD-10-CM | POA: Diagnosis not present

## 2017-05-19 DIAGNOSIS — M1990 Unspecified osteoarthritis, unspecified site: Secondary | ICD-10-CM | POA: Diagnosis not present

## 2017-05-19 DIAGNOSIS — J309 Allergic rhinitis, unspecified: Secondary | ICD-10-CM | POA: Diagnosis not present

## 2017-05-19 DIAGNOSIS — G8929 Other chronic pain: Secondary | ICD-10-CM | POA: Diagnosis not present

## 2017-05-19 DIAGNOSIS — E669 Obesity, unspecified: Secondary | ICD-10-CM | POA: Diagnosis not present

## 2017-06-29 DIAGNOSIS — M7531 Calcific tendinitis of right shoulder: Secondary | ICD-10-CM | POA: Diagnosis not present

## 2017-06-29 DIAGNOSIS — M170 Bilateral primary osteoarthritis of knee: Secondary | ICD-10-CM | POA: Diagnosis not present

## 2017-06-29 DIAGNOSIS — M1711 Unilateral primary osteoarthritis, right knee: Secondary | ICD-10-CM | POA: Diagnosis not present

## 2017-06-29 DIAGNOSIS — M5137 Other intervertebral disc degeneration, lumbosacral region: Secondary | ICD-10-CM | POA: Diagnosis not present

## 2017-06-29 DIAGNOSIS — M1712 Unilateral primary osteoarthritis, left knee: Secondary | ICD-10-CM | POA: Diagnosis not present

## 2017-06-29 DIAGNOSIS — M545 Low back pain: Secondary | ICD-10-CM | POA: Diagnosis not present

## 2017-07-11 DIAGNOSIS — M1731 Unilateral post-traumatic osteoarthritis, right knee: Secondary | ICD-10-CM | POA: Diagnosis not present

## 2017-07-11 DIAGNOSIS — M1732 Unilateral post-traumatic osteoarthritis, left knee: Secondary | ICD-10-CM | POA: Diagnosis not present

## 2017-07-20 DIAGNOSIS — I10 Essential (primary) hypertension: Secondary | ICD-10-CM | POA: Diagnosis not present

## 2017-07-20 DIAGNOSIS — K210 Gastro-esophageal reflux disease with esophagitis: Secondary | ICD-10-CM | POA: Diagnosis not present

## 2017-07-20 DIAGNOSIS — E119 Type 2 diabetes mellitus without complications: Secondary | ICD-10-CM | POA: Diagnosis not present

## 2017-07-20 DIAGNOSIS — L233 Allergic contact dermatitis due to drugs in contact with skin: Secondary | ICD-10-CM | POA: Diagnosis not present

## 2017-07-20 DIAGNOSIS — Z6826 Body mass index (BMI) 26.0-26.9, adult: Secondary | ICD-10-CM | POA: Diagnosis not present

## 2017-07-20 DIAGNOSIS — E7849 Other hyperlipidemia: Secondary | ICD-10-CM | POA: Diagnosis not present

## 2017-09-06 DIAGNOSIS — E119 Type 2 diabetes mellitus without complications: Secondary | ICD-10-CM | POA: Diagnosis not present

## 2017-09-06 DIAGNOSIS — I10 Essential (primary) hypertension: Secondary | ICD-10-CM | POA: Diagnosis not present

## 2017-09-06 DIAGNOSIS — E7849 Other hyperlipidemia: Secondary | ICD-10-CM | POA: Diagnosis not present

## 2017-09-06 DIAGNOSIS — K210 Gastro-esophageal reflux disease with esophagitis: Secondary | ICD-10-CM | POA: Diagnosis not present

## 2017-10-14 DIAGNOSIS — M06062 Rheumatoid arthritis without rheumatoid factor, left knee: Secondary | ICD-10-CM | POA: Diagnosis not present

## 2017-10-14 DIAGNOSIS — M25512 Pain in left shoulder: Secondary | ICD-10-CM | POA: Diagnosis not present

## 2017-10-14 DIAGNOSIS — M25511 Pain in right shoulder: Secondary | ICD-10-CM | POA: Diagnosis not present

## 2017-10-14 DIAGNOSIS — M545 Low back pain: Secondary | ICD-10-CM | POA: Diagnosis not present

## 2017-10-14 DIAGNOSIS — M06061 Rheumatoid arthritis without rheumatoid factor, right knee: Secondary | ICD-10-CM | POA: Diagnosis not present

## 2017-10-20 DIAGNOSIS — Z1389 Encounter for screening for other disorder: Secondary | ICD-10-CM | POA: Diagnosis not present

## 2017-10-20 DIAGNOSIS — E119 Type 2 diabetes mellitus without complications: Secondary | ICD-10-CM | POA: Diagnosis not present

## 2017-10-20 DIAGNOSIS — I10 Essential (primary) hypertension: Secondary | ICD-10-CM | POA: Diagnosis not present

## 2017-10-20 DIAGNOSIS — E7849 Other hyperlipidemia: Secondary | ICD-10-CM | POA: Diagnosis not present

## 2017-10-20 DIAGNOSIS — Z6825 Body mass index (BMI) 25.0-25.9, adult: Secondary | ICD-10-CM | POA: Diagnosis not present

## 2017-10-20 DIAGNOSIS — K210 Gastro-esophageal reflux disease with esophagitis: Secondary | ICD-10-CM | POA: Diagnosis not present

## 2017-10-20 DIAGNOSIS — Z0000 Encounter for general adult medical examination without abnormal findings: Secondary | ICD-10-CM | POA: Diagnosis not present

## 2017-11-29 DIAGNOSIS — R69 Illness, unspecified: Secondary | ICD-10-CM | POA: Diagnosis not present

## 2017-12-16 DIAGNOSIS — R69 Illness, unspecified: Secondary | ICD-10-CM | POA: Diagnosis not present

## 2018-01-09 DIAGNOSIS — E7849 Other hyperlipidemia: Secondary | ICD-10-CM | POA: Diagnosis not present

## 2018-01-09 DIAGNOSIS — I10 Essential (primary) hypertension: Secondary | ICD-10-CM | POA: Diagnosis not present

## 2018-01-09 DIAGNOSIS — E119 Type 2 diabetes mellitus without complications: Secondary | ICD-10-CM | POA: Diagnosis not present

## 2018-01-09 DIAGNOSIS — K210 Gastro-esophageal reflux disease with esophagitis: Secondary | ICD-10-CM | POA: Diagnosis not present

## 2018-01-18 DIAGNOSIS — R69 Illness, unspecified: Secondary | ICD-10-CM | POA: Diagnosis not present

## 2018-01-31 DIAGNOSIS — Z130 Encounter for screening for diseases of the blood and blood-forming organs and certain disorders involving the immune mechanism: Secondary | ICD-10-CM | POA: Diagnosis not present

## 2018-01-31 DIAGNOSIS — E785 Hyperlipidemia, unspecified: Secondary | ICD-10-CM | POA: Diagnosis not present

## 2018-01-31 DIAGNOSIS — Z7189 Other specified counseling: Secondary | ICD-10-CM | POA: Diagnosis not present

## 2018-01-31 DIAGNOSIS — M2669 Other specified disorders of temporomandibular joint: Secondary | ICD-10-CM | POA: Diagnosis not present

## 2018-01-31 DIAGNOSIS — I10 Essential (primary) hypertension: Secondary | ICD-10-CM | POA: Diagnosis not present

## 2018-01-31 DIAGNOSIS — Z8639 Personal history of other endocrine, nutritional and metabolic disease: Secondary | ICD-10-CM | POA: Diagnosis not present

## 2018-01-31 DIAGNOSIS — Z780 Asymptomatic menopausal state: Secondary | ICD-10-CM | POA: Diagnosis not present

## 2018-01-31 DIAGNOSIS — Z0000 Encounter for general adult medical examination without abnormal findings: Secondary | ICD-10-CM | POA: Diagnosis not present

## 2018-01-31 DIAGNOSIS — L659 Nonscarring hair loss, unspecified: Secondary | ICD-10-CM | POA: Diagnosis not present

## 2018-03-13 DIAGNOSIS — Z780 Asymptomatic menopausal state: Secondary | ICD-10-CM | POA: Diagnosis not present

## 2018-03-27 DIAGNOSIS — R69 Illness, unspecified: Secondary | ICD-10-CM | POA: Diagnosis not present

## 2018-04-02 DIAGNOSIS — R0789 Other chest pain: Secondary | ICD-10-CM | POA: Diagnosis not present

## 2018-04-02 DIAGNOSIS — M1990 Unspecified osteoarthritis, unspecified site: Secondary | ICD-10-CM | POA: Diagnosis not present

## 2018-04-02 DIAGNOSIS — Z90710 Acquired absence of both cervix and uterus: Secondary | ICD-10-CM | POA: Diagnosis not present

## 2018-04-02 DIAGNOSIS — R0602 Shortness of breath: Secondary | ICD-10-CM | POA: Diagnosis not present

## 2018-04-02 DIAGNOSIS — Z888 Allergy status to other drugs, medicaments and biological substances status: Secondary | ICD-10-CM | POA: Diagnosis not present

## 2018-04-02 DIAGNOSIS — R072 Precordial pain: Secondary | ICD-10-CM | POA: Diagnosis not present

## 2018-04-02 DIAGNOSIS — R110 Nausea: Secondary | ICD-10-CM | POA: Diagnosis not present

## 2018-04-02 DIAGNOSIS — Z79899 Other long term (current) drug therapy: Secondary | ICD-10-CM | POA: Diagnosis not present

## 2018-04-02 DIAGNOSIS — R079 Chest pain, unspecified: Secondary | ICD-10-CM | POA: Diagnosis not present

## 2018-04-02 DIAGNOSIS — I10 Essential (primary) hypertension: Secondary | ICD-10-CM | POA: Diagnosis not present

## 2018-04-02 DIAGNOSIS — I200 Unstable angina: Secondary | ICD-10-CM | POA: Diagnosis not present

## 2018-04-03 DIAGNOSIS — R079 Chest pain, unspecified: Secondary | ICD-10-CM | POA: Diagnosis not present

## 2018-04-18 DIAGNOSIS — R0789 Other chest pain: Secondary | ICD-10-CM | POA: Diagnosis not present

## 2018-06-23 DIAGNOSIS — R69 Illness, unspecified: Secondary | ICD-10-CM | POA: Diagnosis not present

## 2018-07-19 DIAGNOSIS — Z8639 Personal history of other endocrine, nutritional and metabolic disease: Secondary | ICD-10-CM | POA: Diagnosis not present

## 2018-07-19 DIAGNOSIS — R7302 Impaired glucose tolerance (oral): Secondary | ICD-10-CM | POA: Diagnosis not present

## 2018-08-09 DIAGNOSIS — R69 Illness, unspecified: Secondary | ICD-10-CM | POA: Diagnosis not present

## 2018-10-03 DIAGNOSIS — Z8709 Personal history of other diseases of the respiratory system: Secondary | ICD-10-CM | POA: Diagnosis not present

## 2018-10-18 DIAGNOSIS — R69 Illness, unspecified: Secondary | ICD-10-CM | POA: Diagnosis not present

## 2018-11-21 DIAGNOSIS — R69 Illness, unspecified: Secondary | ICD-10-CM | POA: Diagnosis not present

## 2018-12-12 DIAGNOSIS — G8929 Other chronic pain: Secondary | ICD-10-CM | POA: Diagnosis not present

## 2018-12-12 DIAGNOSIS — E785 Hyperlipidemia, unspecified: Secondary | ICD-10-CM | POA: Diagnosis not present

## 2018-12-12 DIAGNOSIS — E119 Type 2 diabetes mellitus without complications: Secondary | ICD-10-CM | POA: Diagnosis not present

## 2018-12-12 DIAGNOSIS — R32 Unspecified urinary incontinence: Secondary | ICD-10-CM | POA: Diagnosis not present

## 2018-12-12 DIAGNOSIS — R69 Illness, unspecified: Secondary | ICD-10-CM | POA: Diagnosis not present

## 2018-12-12 DIAGNOSIS — M1990 Unspecified osteoarthritis, unspecified site: Secondary | ICD-10-CM | POA: Diagnosis not present

## 2018-12-12 DIAGNOSIS — I10 Essential (primary) hypertension: Secondary | ICD-10-CM | POA: Diagnosis not present

## 2018-12-12 DIAGNOSIS — R609 Edema, unspecified: Secondary | ICD-10-CM | POA: Diagnosis not present

## 2018-12-18 DIAGNOSIS — Z0100 Encounter for examination of eyes and vision without abnormal findings: Secondary | ICD-10-CM | POA: Diagnosis not present

## 2019-01-03 DIAGNOSIS — J069 Acute upper respiratory infection, unspecified: Secondary | ICD-10-CM | POA: Diagnosis not present

## 2019-01-03 DIAGNOSIS — R0602 Shortness of breath: Secondary | ICD-10-CM | POA: Diagnosis not present

## 2019-02-05 DIAGNOSIS — E785 Hyperlipidemia, unspecified: Secondary | ICD-10-CM | POA: Diagnosis not present

## 2019-02-05 DIAGNOSIS — I10 Essential (primary) hypertension: Secondary | ICD-10-CM | POA: Diagnosis not present

## 2019-02-05 DIAGNOSIS — Z13220 Encounter for screening for lipoid disorders: Secondary | ICD-10-CM | POA: Diagnosis not present

## 2019-02-05 DIAGNOSIS — R7302 Impaired glucose tolerance (oral): Secondary | ICD-10-CM | POA: Diagnosis not present

## 2019-02-05 DIAGNOSIS — Z0000 Encounter for general adult medical examination without abnormal findings: Secondary | ICD-10-CM | POA: Diagnosis not present

## 2019-02-05 DIAGNOSIS — Z136 Encounter for screening for cardiovascular disorders: Secondary | ICD-10-CM | POA: Diagnosis not present

## 2019-03-01 IMAGING — NM NM MYOCAR MULTI W/SPECT W/WALL MOTION & EF
2 series · 12 of 12 positions shown · non-contrast
Comparison: none

[Series 1: rest · 6.51mm/px · 6 of 64 frames shown]
[frame 6/64]
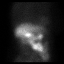
[frame 16/64]
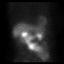
[frame 27/64]
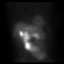
[frame 38/64]
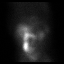
[frame 48/64]
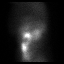
[frame 59/64]
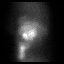

[Series 2: stress gated · 6.51mm/px · 6 of 64 frames shown]
[frame 6/64]
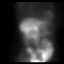
[frame 16/64]
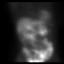
[frame 27/64]
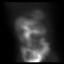
[frame 38/64]
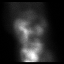
[frame 48/64]
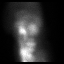
[frame 59/64]
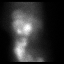

[12 of 12 positions shown; findings below may reference images not displayed]

Canned report from images found in remote index.

Refer to host system for actual result text.

## 2022-03-26 DIAGNOSIS — Z872 Personal history of diseases of the skin and subcutaneous tissue: Secondary | ICD-10-CM | POA: Diagnosis not present

## 2022-05-24 DIAGNOSIS — I739 Peripheral vascular disease, unspecified: Secondary | ICD-10-CM | POA: Diagnosis not present

## 2022-05-24 DIAGNOSIS — M79672 Pain in left foot: Secondary | ICD-10-CM | POA: Diagnosis not present

## 2022-05-24 DIAGNOSIS — M79671 Pain in right foot: Secondary | ICD-10-CM | POA: Diagnosis not present

## 2022-05-24 DIAGNOSIS — L110 Acquired keratosis follicularis: Secondary | ICD-10-CM | POA: Diagnosis not present

## 2022-06-02 DIAGNOSIS — I10 Essential (primary) hypertension: Secondary | ICD-10-CM | POA: Diagnosis not present

## 2022-07-01 DIAGNOSIS — I10 Essential (primary) hypertension: Secondary | ICD-10-CM | POA: Diagnosis not present

## 2022-07-01 DIAGNOSIS — Z0001 Encounter for general adult medical examination with abnormal findings: Secondary | ICD-10-CM | POA: Diagnosis not present

## 2022-07-27 DIAGNOSIS — Z1321 Encounter for screening for nutritional disorder: Secondary | ICD-10-CM | POA: Diagnosis not present

## 2022-07-27 DIAGNOSIS — Z131 Encounter for screening for diabetes mellitus: Secondary | ICD-10-CM | POA: Diagnosis not present

## 2022-07-27 DIAGNOSIS — I10 Essential (primary) hypertension: Secondary | ICD-10-CM | POA: Diagnosis not present

## 2022-07-27 DIAGNOSIS — Z1329 Encounter for screening for other suspected endocrine disorder: Secondary | ICD-10-CM | POA: Diagnosis not present

## 2022-07-27 DIAGNOSIS — Z0001 Encounter for general adult medical examination with abnormal findings: Secondary | ICD-10-CM | POA: Diagnosis not present

## 2022-08-03 DIAGNOSIS — I10 Essential (primary) hypertension: Secondary | ICD-10-CM | POA: Diagnosis not present

## 2022-08-03 DIAGNOSIS — Z0001 Encounter for general adult medical examination with abnormal findings: Secondary | ICD-10-CM | POA: Diagnosis not present

## 2022-08-03 DIAGNOSIS — Z23 Encounter for immunization: Secondary | ICD-10-CM | POA: Diagnosis not present

## 2022-08-03 DIAGNOSIS — R7303 Prediabetes: Secondary | ICD-10-CM | POA: Diagnosis not present

## 2022-08-30 DIAGNOSIS — M79671 Pain in right foot: Secondary | ICD-10-CM | POA: Diagnosis not present

## 2022-08-30 DIAGNOSIS — M79672 Pain in left foot: Secondary | ICD-10-CM | POA: Diagnosis not present

## 2022-08-30 DIAGNOSIS — L110 Acquired keratosis follicularis: Secondary | ICD-10-CM | POA: Diagnosis not present

## 2022-08-30 DIAGNOSIS — I739 Peripheral vascular disease, unspecified: Secondary | ICD-10-CM | POA: Diagnosis not present

## 2022-09-14 DIAGNOSIS — I10 Essential (primary) hypertension: Secondary | ICD-10-CM | POA: Diagnosis not present

## 2022-09-14 DIAGNOSIS — R7303 Prediabetes: Secondary | ICD-10-CM | POA: Diagnosis not present

## 2022-09-21 DIAGNOSIS — M25539 Pain in unspecified wrist: Secondary | ICD-10-CM | POA: Diagnosis not present

## 2022-09-21 DIAGNOSIS — M25531 Pain in right wrist: Secondary | ICD-10-CM | POA: Diagnosis not present

## 2022-09-21 DIAGNOSIS — M1811 Unilateral primary osteoarthritis of first carpometacarpal joint, right hand: Secondary | ICD-10-CM | POA: Diagnosis not present

## 2022-10-06 DIAGNOSIS — R202 Paresthesia of skin: Secondary | ICD-10-CM | POA: Diagnosis not present

## 2022-10-06 DIAGNOSIS — M6281 Muscle weakness (generalized): Secondary | ICD-10-CM | POA: Diagnosis not present

## 2022-10-06 DIAGNOSIS — M25641 Stiffness of right hand, not elsewhere classified: Secondary | ICD-10-CM | POA: Diagnosis not present

## 2022-10-06 DIAGNOSIS — M25531 Pain in right wrist: Secondary | ICD-10-CM | POA: Diagnosis not present

## 2022-10-06 DIAGNOSIS — M25631 Stiffness of right wrist, not elsewhere classified: Secondary | ICD-10-CM | POA: Diagnosis not present

## 2022-10-06 DIAGNOSIS — M79641 Pain in right hand: Secondary | ICD-10-CM | POA: Diagnosis not present

## 2022-10-12 DIAGNOSIS — M6281 Muscle weakness (generalized): Secondary | ICD-10-CM | POA: Diagnosis not present

## 2022-10-12 DIAGNOSIS — R202 Paresthesia of skin: Secondary | ICD-10-CM | POA: Diagnosis not present

## 2022-10-12 DIAGNOSIS — M25641 Stiffness of right hand, not elsewhere classified: Secondary | ICD-10-CM | POA: Diagnosis not present

## 2022-10-12 DIAGNOSIS — M25531 Pain in right wrist: Secondary | ICD-10-CM | POA: Diagnosis not present

## 2022-10-12 DIAGNOSIS — M79641 Pain in right hand: Secondary | ICD-10-CM | POA: Diagnosis not present

## 2022-10-12 DIAGNOSIS — M25631 Stiffness of right wrist, not elsewhere classified: Secondary | ICD-10-CM | POA: Diagnosis not present

## 2022-10-15 DIAGNOSIS — M25631 Stiffness of right wrist, not elsewhere classified: Secondary | ICD-10-CM | POA: Diagnosis not present

## 2022-10-15 DIAGNOSIS — R202 Paresthesia of skin: Secondary | ICD-10-CM | POA: Diagnosis not present

## 2022-10-15 DIAGNOSIS — M25641 Stiffness of right hand, not elsewhere classified: Secondary | ICD-10-CM | POA: Diagnosis not present

## 2022-10-15 DIAGNOSIS — M6281 Muscle weakness (generalized): Secondary | ICD-10-CM | POA: Diagnosis not present

## 2022-10-15 DIAGNOSIS — M79641 Pain in right hand: Secondary | ICD-10-CM | POA: Diagnosis not present

## 2022-10-15 DIAGNOSIS — M25531 Pain in right wrist: Secondary | ICD-10-CM | POA: Diagnosis not present

## 2022-10-19 DIAGNOSIS — M25641 Stiffness of right hand, not elsewhere classified: Secondary | ICD-10-CM | POA: Diagnosis not present

## 2022-10-19 DIAGNOSIS — R202 Paresthesia of skin: Secondary | ICD-10-CM | POA: Diagnosis not present

## 2022-10-19 DIAGNOSIS — M6281 Muscle weakness (generalized): Secondary | ICD-10-CM | POA: Diagnosis not present

## 2022-10-19 DIAGNOSIS — M25531 Pain in right wrist: Secondary | ICD-10-CM | POA: Diagnosis not present

## 2022-10-19 DIAGNOSIS — M25631 Stiffness of right wrist, not elsewhere classified: Secondary | ICD-10-CM | POA: Diagnosis not present

## 2022-10-19 DIAGNOSIS — M79641 Pain in right hand: Secondary | ICD-10-CM | POA: Diagnosis not present

## 2022-10-22 DIAGNOSIS — M79641 Pain in right hand: Secondary | ICD-10-CM | POA: Diagnosis not present

## 2022-10-22 DIAGNOSIS — R202 Paresthesia of skin: Secondary | ICD-10-CM | POA: Diagnosis not present

## 2022-10-22 DIAGNOSIS — M25531 Pain in right wrist: Secondary | ICD-10-CM | POA: Diagnosis not present

## 2022-10-22 DIAGNOSIS — M6281 Muscle weakness (generalized): Secondary | ICD-10-CM | POA: Diagnosis not present

## 2022-10-22 DIAGNOSIS — M25631 Stiffness of right wrist, not elsewhere classified: Secondary | ICD-10-CM | POA: Diagnosis not present

## 2022-10-22 DIAGNOSIS — M25641 Stiffness of right hand, not elsewhere classified: Secondary | ICD-10-CM | POA: Diagnosis not present

## 2022-10-25 DIAGNOSIS — R202 Paresthesia of skin: Secondary | ICD-10-CM | POA: Diagnosis not present

## 2022-10-25 DIAGNOSIS — M79641 Pain in right hand: Secondary | ICD-10-CM | POA: Diagnosis not present

## 2022-10-25 DIAGNOSIS — M6281 Muscle weakness (generalized): Secondary | ICD-10-CM | POA: Diagnosis not present

## 2022-10-25 DIAGNOSIS — M25531 Pain in right wrist: Secondary | ICD-10-CM | POA: Diagnosis not present

## 2022-10-25 DIAGNOSIS — M25631 Stiffness of right wrist, not elsewhere classified: Secondary | ICD-10-CM | POA: Diagnosis not present

## 2022-10-25 DIAGNOSIS — M25641 Stiffness of right hand, not elsewhere classified: Secondary | ICD-10-CM | POA: Diagnosis not present

## 2022-10-26 DIAGNOSIS — R7303 Prediabetes: Secondary | ICD-10-CM | POA: Diagnosis not present

## 2022-10-26 DIAGNOSIS — E7800 Pure hypercholesterolemia, unspecified: Secondary | ICD-10-CM | POA: Diagnosis not present

## 2022-10-26 DIAGNOSIS — I10 Essential (primary) hypertension: Secondary | ICD-10-CM | POA: Diagnosis not present

## 2022-11-01 DIAGNOSIS — R7303 Prediabetes: Secondary | ICD-10-CM | POA: Diagnosis not present

## 2022-11-01 DIAGNOSIS — Z23 Encounter for immunization: Secondary | ICD-10-CM | POA: Diagnosis not present

## 2022-11-01 DIAGNOSIS — I10 Essential (primary) hypertension: Secondary | ICD-10-CM | POA: Diagnosis not present

## 2022-11-02 DIAGNOSIS — M79641 Pain in right hand: Secondary | ICD-10-CM | POA: Diagnosis not present

## 2022-11-02 DIAGNOSIS — M25641 Stiffness of right hand, not elsewhere classified: Secondary | ICD-10-CM | POA: Diagnosis not present

## 2022-11-02 DIAGNOSIS — M6281 Muscle weakness (generalized): Secondary | ICD-10-CM | POA: Diagnosis not present

## 2022-11-02 DIAGNOSIS — M25531 Pain in right wrist: Secondary | ICD-10-CM | POA: Diagnosis not present

## 2022-11-02 DIAGNOSIS — R202 Paresthesia of skin: Secondary | ICD-10-CM | POA: Diagnosis not present

## 2022-11-02 DIAGNOSIS — M25631 Stiffness of right wrist, not elsewhere classified: Secondary | ICD-10-CM | POA: Diagnosis not present

## 2022-11-09 DIAGNOSIS — M25641 Stiffness of right hand, not elsewhere classified: Secondary | ICD-10-CM | POA: Diagnosis not present

## 2022-11-09 DIAGNOSIS — M79641 Pain in right hand: Secondary | ICD-10-CM | POA: Diagnosis not present

## 2022-11-09 DIAGNOSIS — M25531 Pain in right wrist: Secondary | ICD-10-CM | POA: Diagnosis not present

## 2022-11-09 DIAGNOSIS — R202 Paresthesia of skin: Secondary | ICD-10-CM | POA: Diagnosis not present

## 2022-11-09 DIAGNOSIS — M6281 Muscle weakness (generalized): Secondary | ICD-10-CM | POA: Diagnosis not present

## 2022-11-09 DIAGNOSIS — M25631 Stiffness of right wrist, not elsewhere classified: Secondary | ICD-10-CM | POA: Diagnosis not present

## 2022-11-16 DIAGNOSIS — R202 Paresthesia of skin: Secondary | ICD-10-CM | POA: Diagnosis not present

## 2022-11-16 DIAGNOSIS — M79641 Pain in right hand: Secondary | ICD-10-CM | POA: Diagnosis not present

## 2022-11-16 DIAGNOSIS — M25641 Stiffness of right hand, not elsewhere classified: Secondary | ICD-10-CM | POA: Diagnosis not present

## 2022-11-16 DIAGNOSIS — M25531 Pain in right wrist: Secondary | ICD-10-CM | POA: Diagnosis not present

## 2022-11-16 DIAGNOSIS — M6281 Muscle weakness (generalized): Secondary | ICD-10-CM | POA: Diagnosis not present

## 2022-11-16 DIAGNOSIS — M25631 Stiffness of right wrist, not elsewhere classified: Secondary | ICD-10-CM | POA: Diagnosis not present

## 2022-11-22 DIAGNOSIS — M25531 Pain in right wrist: Secondary | ICD-10-CM | POA: Diagnosis not present

## 2022-11-22 DIAGNOSIS — R609 Edema, unspecified: Secondary | ICD-10-CM | POA: Diagnosis not present

## 2022-11-22 DIAGNOSIS — M25539 Pain in unspecified wrist: Secondary | ICD-10-CM | POA: Diagnosis not present

## 2022-11-22 DIAGNOSIS — M654 Radial styloid tenosynovitis [de Quervain]: Secondary | ICD-10-CM | POA: Diagnosis not present

## 2022-11-22 DIAGNOSIS — Q740 Other congenital malformations of upper limb(s), including shoulder girdle: Secondary | ICD-10-CM | POA: Diagnosis not present

## 2022-11-22 DIAGNOSIS — M25431 Effusion, right wrist: Secondary | ICD-10-CM | POA: Diagnosis not present

## 2022-11-23 DIAGNOSIS — S638X1S Sprain of other part of right wrist and hand, sequela: Secondary | ICD-10-CM | POA: Diagnosis not present

## 2022-11-23 DIAGNOSIS — M654 Radial styloid tenosynovitis [de Quervain]: Secondary | ICD-10-CM | POA: Diagnosis not present

## 2022-11-25 DIAGNOSIS — R202 Paresthesia of skin: Secondary | ICD-10-CM | POA: Diagnosis not present

## 2022-11-25 DIAGNOSIS — M25531 Pain in right wrist: Secondary | ICD-10-CM | POA: Diagnosis not present

## 2022-11-25 DIAGNOSIS — M25641 Stiffness of right hand, not elsewhere classified: Secondary | ICD-10-CM | POA: Diagnosis not present

## 2022-11-25 DIAGNOSIS — M6281 Muscle weakness (generalized): Secondary | ICD-10-CM | POA: Diagnosis not present

## 2022-11-25 DIAGNOSIS — M25631 Stiffness of right wrist, not elsewhere classified: Secondary | ICD-10-CM | POA: Diagnosis not present

## 2022-11-25 DIAGNOSIS — M79641 Pain in right hand: Secondary | ICD-10-CM | POA: Diagnosis not present

## 2022-11-29 DIAGNOSIS — M25531 Pain in right wrist: Secondary | ICD-10-CM | POA: Diagnosis not present

## 2022-11-29 DIAGNOSIS — I10 Essential (primary) hypertension: Secondary | ICD-10-CM | POA: Diagnosis not present

## 2022-11-29 DIAGNOSIS — M79671 Pain in right foot: Secondary | ICD-10-CM | POA: Diagnosis not present

## 2022-11-29 DIAGNOSIS — M79672 Pain in left foot: Secondary | ICD-10-CM | POA: Diagnosis not present

## 2022-11-29 DIAGNOSIS — M25641 Stiffness of right hand, not elsewhere classified: Secondary | ICD-10-CM | POA: Diagnosis not present

## 2022-11-29 DIAGNOSIS — I739 Peripheral vascular disease, unspecified: Secondary | ICD-10-CM | POA: Diagnosis not present

## 2022-11-29 DIAGNOSIS — M79641 Pain in right hand: Secondary | ICD-10-CM | POA: Diagnosis not present

## 2022-11-29 DIAGNOSIS — R202 Paresthesia of skin: Secondary | ICD-10-CM | POA: Diagnosis not present

## 2022-11-29 DIAGNOSIS — M6281 Muscle weakness (generalized): Secondary | ICD-10-CM | POA: Diagnosis not present

## 2022-11-29 DIAGNOSIS — L110 Acquired keratosis follicularis: Secondary | ICD-10-CM | POA: Diagnosis not present

## 2022-11-29 DIAGNOSIS — R7303 Prediabetes: Secondary | ICD-10-CM | POA: Diagnosis not present

## 2022-11-29 DIAGNOSIS — M25539 Pain in unspecified wrist: Secondary | ICD-10-CM | POA: Diagnosis not present

## 2022-11-29 DIAGNOSIS — M25631 Stiffness of right wrist, not elsewhere classified: Secondary | ICD-10-CM | POA: Diagnosis not present

## 2022-12-07 DIAGNOSIS — M25531 Pain in right wrist: Secondary | ICD-10-CM | POA: Diagnosis not present

## 2022-12-07 DIAGNOSIS — M25641 Stiffness of right hand, not elsewhere classified: Secondary | ICD-10-CM | POA: Diagnosis not present

## 2022-12-07 DIAGNOSIS — R202 Paresthesia of skin: Secondary | ICD-10-CM | POA: Diagnosis not present

## 2022-12-07 DIAGNOSIS — M25631 Stiffness of right wrist, not elsewhere classified: Secondary | ICD-10-CM | POA: Diagnosis not present

## 2022-12-07 DIAGNOSIS — M79641 Pain in right hand: Secondary | ICD-10-CM | POA: Diagnosis not present

## 2022-12-07 DIAGNOSIS — M6281 Muscle weakness (generalized): Secondary | ICD-10-CM | POA: Diagnosis not present

## 2022-12-10 DIAGNOSIS — M654 Radial styloid tenosynovitis [de Quervain]: Secondary | ICD-10-CM | POA: Diagnosis not present

## 2022-12-10 DIAGNOSIS — S638X1S Sprain of other part of right wrist and hand, sequela: Secondary | ICD-10-CM | POA: Diagnosis not present

## 2022-12-10 DIAGNOSIS — X58XXXS Exposure to other specified factors, sequela: Secondary | ICD-10-CM | POA: Diagnosis not present

## 2022-12-24 DIAGNOSIS — M654 Radial styloid tenosynovitis [de Quervain]: Secondary | ICD-10-CM | POA: Diagnosis not present

## 2022-12-24 DIAGNOSIS — S638X1S Sprain of other part of right wrist and hand, sequela: Secondary | ICD-10-CM | POA: Diagnosis not present

## 2023-03-22 DIAGNOSIS — M79671 Pain in right foot: Secondary | ICD-10-CM | POA: Diagnosis not present

## 2023-03-22 DIAGNOSIS — L110 Acquired keratosis follicularis: Secondary | ICD-10-CM | POA: Diagnosis not present

## 2023-03-22 DIAGNOSIS — I739 Peripheral vascular disease, unspecified: Secondary | ICD-10-CM | POA: Diagnosis not present

## 2023-03-22 DIAGNOSIS — M79672 Pain in left foot: Secondary | ICD-10-CM | POA: Diagnosis not present

## 2023-04-25 DIAGNOSIS — R739 Hyperglycemia, unspecified: Secondary | ICD-10-CM | POA: Diagnosis not present

## 2023-04-25 DIAGNOSIS — I10 Essential (primary) hypertension: Secondary | ICD-10-CM | POA: Diagnosis not present

## 2023-05-02 DIAGNOSIS — E7800 Pure hypercholesterolemia, unspecified: Secondary | ICD-10-CM | POA: Diagnosis not present

## 2023-05-02 DIAGNOSIS — I10 Essential (primary) hypertension: Secondary | ICD-10-CM | POA: Diagnosis not present

## 2023-05-02 DIAGNOSIS — R7303 Prediabetes: Secondary | ICD-10-CM | POA: Diagnosis not present

## 2023-05-17 DIAGNOSIS — M26622 Arthralgia of left temporomandibular joint: Secondary | ICD-10-CM | POA: Diagnosis not present

## 2023-05-17 DIAGNOSIS — M542 Cervicalgia: Secondary | ICD-10-CM | POA: Diagnosis not present

## 2023-05-17 DIAGNOSIS — H9202 Otalgia, left ear: Secondary | ICD-10-CM | POA: Diagnosis not present

## 2023-06-20 DIAGNOSIS — L110 Acquired keratosis follicularis: Secondary | ICD-10-CM | POA: Diagnosis not present

## 2023-06-20 DIAGNOSIS — M79672 Pain in left foot: Secondary | ICD-10-CM | POA: Diagnosis not present

## 2023-06-20 DIAGNOSIS — M79671 Pain in right foot: Secondary | ICD-10-CM | POA: Diagnosis not present

## 2023-06-20 DIAGNOSIS — I739 Peripheral vascular disease, unspecified: Secondary | ICD-10-CM | POA: Diagnosis not present

## 2023-06-24 DIAGNOSIS — I10 Essential (primary) hypertension: Secondary | ICD-10-CM | POA: Diagnosis not present

## 2023-06-24 DIAGNOSIS — R7303 Prediabetes: Secondary | ICD-10-CM | POA: Diagnosis not present

## 2023-06-24 DIAGNOSIS — E7800 Pure hypercholesterolemia, unspecified: Secondary | ICD-10-CM | POA: Diagnosis not present

## 2023-06-24 DIAGNOSIS — Z0000 Encounter for general adult medical examination without abnormal findings: Secondary | ICD-10-CM | POA: Diagnosis not present

## 2023-06-24 DIAGNOSIS — Z0001 Encounter for general adult medical examination with abnormal findings: Secondary | ICD-10-CM | POA: Diagnosis not present

## 2023-06-24 DIAGNOSIS — Z1389 Encounter for screening for other disorder: Secondary | ICD-10-CM | POA: Diagnosis not present

## 2023-07-22 DIAGNOSIS — E7800 Pure hypercholesterolemia, unspecified: Secondary | ICD-10-CM | POA: Diagnosis not present

## 2023-07-22 DIAGNOSIS — R7303 Prediabetes: Secondary | ICD-10-CM | POA: Diagnosis not present

## 2023-07-22 DIAGNOSIS — I10 Essential (primary) hypertension: Secondary | ICD-10-CM | POA: Diagnosis not present

## 2023-07-29 DIAGNOSIS — S0083XA Contusion of other part of head, initial encounter: Secondary | ICD-10-CM | POA: Diagnosis not present

## 2023-07-29 DIAGNOSIS — S0003XA Contusion of scalp, initial encounter: Secondary | ICD-10-CM | POA: Diagnosis not present

## 2023-07-29 DIAGNOSIS — G930 Cerebral cysts: Secondary | ICD-10-CM | POA: Diagnosis not present

## 2023-07-29 DIAGNOSIS — Z79899 Other long term (current) drug therapy: Secondary | ICD-10-CM | POA: Diagnosis not present

## 2023-07-29 DIAGNOSIS — E119 Type 2 diabetes mellitus without complications: Secondary | ICD-10-CM | POA: Diagnosis not present

## 2023-07-29 DIAGNOSIS — I10 Essential (primary) hypertension: Secondary | ICD-10-CM | POA: Diagnosis not present

## 2023-07-29 DIAGNOSIS — E785 Hyperlipidemia, unspecified: Secondary | ICD-10-CM | POA: Diagnosis not present

## 2023-07-29 DIAGNOSIS — W1839XA Other fall on same level, initial encounter: Secondary | ICD-10-CM | POA: Diagnosis not present

## 2023-08-01 DIAGNOSIS — G930 Cerebral cysts: Secondary | ICD-10-CM | POA: Diagnosis not present

## 2023-08-15 DIAGNOSIS — G930 Cerebral cysts: Secondary | ICD-10-CM | POA: Diagnosis not present

## 2023-08-16 DIAGNOSIS — M542 Cervicalgia: Secondary | ICD-10-CM | POA: Diagnosis not present

## 2023-08-16 DIAGNOSIS — M79622 Pain in left upper arm: Secondary | ICD-10-CM | POA: Diagnosis not present

## 2023-08-16 DIAGNOSIS — M6281 Muscle weakness (generalized): Secondary | ICD-10-CM | POA: Diagnosis not present

## 2023-08-22 DIAGNOSIS — M79622 Pain in left upper arm: Secondary | ICD-10-CM | POA: Diagnosis not present

## 2023-08-22 DIAGNOSIS — M6281 Muscle weakness (generalized): Secondary | ICD-10-CM | POA: Diagnosis not present

## 2023-08-22 DIAGNOSIS — M542 Cervicalgia: Secondary | ICD-10-CM | POA: Diagnosis not present

## 2023-08-29 DIAGNOSIS — M79622 Pain in left upper arm: Secondary | ICD-10-CM | POA: Diagnosis not present

## 2023-08-29 DIAGNOSIS — M6281 Muscle weakness (generalized): Secondary | ICD-10-CM | POA: Diagnosis not present

## 2023-08-29 DIAGNOSIS — M542 Cervicalgia: Secondary | ICD-10-CM | POA: Diagnosis not present

## 2023-09-05 DIAGNOSIS — M6281 Muscle weakness (generalized): Secondary | ICD-10-CM | POA: Diagnosis not present

## 2023-09-05 DIAGNOSIS — M542 Cervicalgia: Secondary | ICD-10-CM | POA: Diagnosis not present

## 2023-09-05 DIAGNOSIS — M79622 Pain in left upper arm: Secondary | ICD-10-CM | POA: Diagnosis not present

## 2023-09-19 DIAGNOSIS — M79671 Pain in right foot: Secondary | ICD-10-CM | POA: Diagnosis not present

## 2023-09-19 DIAGNOSIS — M79672 Pain in left foot: Secondary | ICD-10-CM | POA: Diagnosis not present

## 2023-09-19 DIAGNOSIS — I739 Peripheral vascular disease, unspecified: Secondary | ICD-10-CM | POA: Diagnosis not present

## 2023-09-19 DIAGNOSIS — L110 Acquired keratosis follicularis: Secondary | ICD-10-CM | POA: Diagnosis not present

## 2023-10-14 DIAGNOSIS — I10 Essential (primary) hypertension: Secondary | ICD-10-CM | POA: Diagnosis not present

## 2023-10-14 DIAGNOSIS — E7800 Pure hypercholesterolemia, unspecified: Secondary | ICD-10-CM | POA: Diagnosis not present

## 2023-10-26 DIAGNOSIS — Z1329 Encounter for screening for other suspected endocrine disorder: Secondary | ICD-10-CM | POA: Diagnosis not present

## 2023-10-26 DIAGNOSIS — Z13220 Encounter for screening for lipoid disorders: Secondary | ICD-10-CM | POA: Diagnosis not present

## 2023-10-26 DIAGNOSIS — Z0001 Encounter for general adult medical examination with abnormal findings: Secondary | ICD-10-CM | POA: Diagnosis not present

## 2023-10-26 DIAGNOSIS — I10 Essential (primary) hypertension: Secondary | ICD-10-CM | POA: Diagnosis not present

## 2023-10-26 DIAGNOSIS — D559 Anemia due to enzyme disorder, unspecified: Secondary | ICD-10-CM | POA: Diagnosis not present

## 2023-10-26 DIAGNOSIS — Z1321 Encounter for screening for nutritional disorder: Secondary | ICD-10-CM | POA: Diagnosis not present

## 2023-10-26 DIAGNOSIS — R7303 Prediabetes: Secondary | ICD-10-CM | POA: Diagnosis not present

## 2023-11-01 DIAGNOSIS — G930 Cerebral cysts: Secondary | ICD-10-CM | POA: Diagnosis not present

## 2023-11-01 DIAGNOSIS — I10 Essential (primary) hypertension: Secondary | ICD-10-CM | POA: Diagnosis not present

## 2023-11-01 DIAGNOSIS — E7800 Pure hypercholesterolemia, unspecified: Secondary | ICD-10-CM | POA: Diagnosis not present

## 2023-11-01 DIAGNOSIS — Z23 Encounter for immunization: Secondary | ICD-10-CM | POA: Diagnosis not present

## 2023-11-01 DIAGNOSIS — Z0001 Encounter for general adult medical examination with abnormal findings: Secondary | ICD-10-CM | POA: Diagnosis not present

## 2023-11-15 DIAGNOSIS — E7800 Pure hypercholesterolemia, unspecified: Secondary | ICD-10-CM | POA: Diagnosis not present

## 2023-11-15 DIAGNOSIS — I10 Essential (primary) hypertension: Secondary | ICD-10-CM | POA: Diagnosis not present

## 2023-11-22 ENCOUNTER — Telehealth: Payer: Self-pay

## 2023-11-22 DIAGNOSIS — Z789 Other specified health status: Secondary | ICD-10-CM

## 2023-11-25 ENCOUNTER — Telehealth: Payer: Self-pay

## 2023-11-25 NOTE — Progress Notes (Signed)
 Complex Care Management Note Care Guide Note  11/25/2023 Name: Kelli Mitchell MRN: [id number] DOB: [birth year]   Complex Care Management Outreach Attempts: An unsuccessful telephone outreach was attempted today to offer the patient information about available complex care management services.  Follow Up Plan:  Additional outreach attempts will be made to offer the patient complex care management information and services.   Encounter Outcome:  No Answer-Left voicemail  Leotis Rase [facility], [facility] Guide  Direct Dial: [phone]  Fax [phone]

## 2023-11-28 NOTE — Progress Notes (Signed)
 Complex Care Management Note  Care Guide Note 11/28/2023 Name: Kelli Mitchell MRN: [id number] DOB: [birth year]  Kelli Mitchell is a 89 y.o. year old female who sees Trudy Vaughn FALCON, MD for primary care. I reached out to Albino Vernard Dawn by phone today to offer complex care management services.  Kelli Mitchell was given information about Complex Care Management services today including:   The Complex Care Management services include support from the care team which includes your Nurse Care Manager, Clinical Social Worker, or Pharmacist.  The Complex Care Management team is here to help remove barriers to the health concerns and goals most important to you. Complex Care Management services are voluntary, and the patient may decline or stop services at any time by request to their care team member.   Complex Care Management Consent Status: Patient agreed to services and verbal consent obtained.   Follow up plan:  Telephone appointment with complex care management team member scheduled for:  11/29/23 @ 1 pm  Encounter Outcome:  Patient Scheduled  Leotis Rase [facility], [facility] Guide  Direct Dial: [phone]  Fax [phone]

## 2023-11-29 ENCOUNTER — Encounter: Payer: Self-pay | Admitting: *Deleted

## 2023-11-29 ENCOUNTER — Other Ambulatory Visit: Payer: Self-pay | Admitting: *Deleted

## 2023-11-29 ENCOUNTER — Other Ambulatory Visit: Payer: Self-pay

## 2023-11-29 DIAGNOSIS — Z789 Other specified health status: Secondary | ICD-10-CM

## 2023-12-02 ENCOUNTER — Other Ambulatory Visit: Payer: Self-pay

## 2023-12-02 NOTE — Patient Instructions (Signed)
 Visit Information  Thank you for taking time to visit with me today. Please don't hesitate to contact me if I can be of assistance to you before our next scheduled appointment.  Our next appointment is by telephone on 10/30 at 2pm Please call the care guide team at [phone] if you need to cancel or reschedule your appointment.     Please call the Suicide and Crisis Lifeline: 988 call the [place]  National Suicide Prevention Lifeline: 210-700-5015 or TTY: 586-745-5688 TTY 407-055-9790) to talk to a trained counselor call 1-800-273-TALK (toll free, 24 hour hotline) go to [facility] [address], [place] [phone]) call 911 if you are experiencing a Mental Health or Behavioral Health Crisis or need someone to talk to.  Patient verbalizes understanding of instructions and care plan provided today and agrees to view in MyChart. Active MyChart status and patient understanding of how to access instructions and care plan via MyChart confirmed with patient.     [name], BSW [place]  [facility] Social Worker, [employer] [facility] [phone]

## 2023-12-02 NOTE — Patient Outreach (Addendum)
 Complex Care Management   Visit Note  12/02/2023  Name:  Kelli Mitchell MRN: [id number] DOB: February 17, 1933  Situation: Referral received for Complex Care Management related to ADL and PCS I obtained verbal consent from Patient.  Visit completed with Patient  on the phone  Background:   Past Medical History:  Diagnosis Date   Gastroesophageal reflux disease with esophagitis    Hyperlipidemia    Hypertension    Measles    Other forms of angina pectoris     Assessment: BSW outreached patient today to assess for SDOH barriers. During the call, no barriers were identified at this time. The initial referral was to assist patient with getting connected to ADL services or personal care services. During the call, patient stated that she has tendinitis in her hands and arthritis, which makes it difficult for her to complete tasks around the home. Patient expressed interest in receiving assistance through ADL or personal care services. BSW informed patient that these services may be covered under her [employer] Dual Complete plan, depending on eligibility and provider approval. BSW will send patient a list of ADL and personal care service agencies in her area and will follow up with patient on October 30 at 2:00 PM.  [facility]  [facility] Care [facility] Care Home Health Assistance [facility]    Recommendation:   No recommendations at this time.  Follow Up Plan:   Telephone follow-up on 10/30 at 2pm  [name], BSW [place]  [facility] Social Worker, [employer] [facility] [phone]

## 2023-12-09 ENCOUNTER — Other Ambulatory Visit: Payer: Self-pay | Admitting: *Deleted

## 2023-12-09 ENCOUNTER — Encounter: Payer: Self-pay | Admitting: *Deleted

## 2023-12-09 NOTE — Patient Instructions (Signed)
 Kelli Mitchell - I am sorry I was unable to reach you today for our scheduled appointment. I work with Trudy Vaughn FALCON, MD and am calling to support your healthcare needs. I will call you again on Tuesday, 12/13/23 around 2:30. I look forward to speaking with you soon.   Thank you,   Josette Pellet, RN, BSN [place]  [facility] RN Care Manager Direct Dial: [phone]  Fax: [phone]

## 2023-12-13 ENCOUNTER — Encounter: Payer: Self-pay | Admitting: *Deleted

## 2023-12-13 ENCOUNTER — Telehealth: Payer: Self-pay | Admitting: *Deleted

## 2023-12-13 NOTE — Progress Notes (Unsigned)
   12/13/2023  Patient ID: Kelli Mitchell, female   DOB: 11/29/1933, 89 y.o.   MRN: [id number]  This patient is appearing on a report for being at risk of failing the adherence measure for hypertension (ACEi/ARB) medications this calendar year.   Medication: Losartan 50 mg tablets Last fill date: 11/26/23 for 90 day supply  Insurance report was not up to date. No action needed at this time.   Zayquan Bogard C. Georgine Wiltse [facility] PharmD Candidate Class of [phone]

## 2023-12-15 ENCOUNTER — Other Ambulatory Visit: Payer: Self-pay

## 2023-12-15 NOTE — Patient Instructions (Signed)
 Visit Information  Thank you for taking time to visit with me today. Please don't hesitate to contact me if I can be of assistance to you before our next scheduled appointment.  Your next care management appointment is no further scheduled appointments.    Patient has met all care management goals. Care Management case will be closed. Patient has been provided contact information should new needs arise.   Please call the care guide team at [phone] if you need to cancel, schedule, or reschedule an appointment.   Please call the Suicide and Crisis Lifeline: 988 call the [place]  National Suicide Prevention Lifeline: 4846803034 or TTY: (361) 689-1567 TTY 6013699724) to talk to a trained counselor call 1-800-273-TALK (toll free, 24 hour hotline) go to [facility] [address], [place] [phone]) call the [facility] Crisis Line: 873-188-1989 call 911 if you are experiencing a Mental Health or Behavioral Health Crisis or need someone to talk to.  [name], BSW [place]  [facility] Social Worker, [employer] [facility] [phone]

## 2023-12-15 NOTE — Patient Outreach (Deleted)
 Social Drivers of Health  Community Resource and Care Coordination Visit Note   12/15/2023  Name: Shailene Demonbreun MRN: [id number] DOB:1933-02-21  Situation: Referral received for [facility] needs assessment and assistance related to Personal care services. I obtained verbal consent from {CHL AMB Patient/Caregiver:28184}.  Visit completed with {patient/caregiver:26000} {VISIT LOCATION:32553}.   Background:      Assessment:   Goals Addressed   None     Recommendation:   {SDOHPTGOALS:26973}  Follow Up Plan:   {VBCICCRCFOLLOWUP:33433}  SIG ***

## 2023-12-15 NOTE — Patient Outreach (Signed)
 Social Drivers of Health  Community Resource and Care Coordination Visit Note   12/15/2023  Name: Kelli Mitchell MRN: [id number] DOB:01/16/1934  Situation: Referral received for [facility] needs assessment and assistance related to Personal care services. I obtained verbal consent from Patient.  Visit completed with Patient on the phone.   Background:      Assessment:   Goals Addressed             This Visit's Progress    COMPLETED: BSW Goal       Current SDOH Barriers:  Personal care services  Interventions: Referred patient to community resources  Assisted patient/caregiver with obtaining information about health plan benefits          Recommendation:   attend all scheduled provider appointments Call agencies provided for PCS  Follow Up Plan:   Patient has achieved all patient stated goals. [employer] will be closed. Patient has been provided contact information should new needs arise.   [name], BSW [place]  [facility] Social Worker, [employer] [facility] [phone]

## 2023-12-19 DIAGNOSIS — L110 Acquired keratosis follicularis: Secondary | ICD-10-CM | POA: Diagnosis not present

## 2023-12-19 DIAGNOSIS — M79672 Pain in left foot: Secondary | ICD-10-CM | POA: Diagnosis not present

## 2023-12-19 DIAGNOSIS — M79671 Pain in right foot: Secondary | ICD-10-CM | POA: Diagnosis not present

## 2023-12-19 DIAGNOSIS — I739 Peripheral vascular disease, unspecified: Secondary | ICD-10-CM | POA: Diagnosis not present

## 2024-01-09 ENCOUNTER — Other Ambulatory Visit: Payer: Self-pay | Admitting: *Deleted

## 2024-01-09 DIAGNOSIS — Z789 Other specified health status: Secondary | ICD-10-CM

## 2024-01-16 ENCOUNTER — Telehealth: Admitting: *Deleted

## 2024-01-19 ENCOUNTER — Encounter: Payer: Self-pay | Admitting: *Deleted

## 2024-01-19 ENCOUNTER — Other Ambulatory Visit: Payer: Self-pay | Admitting: *Deleted

## 2024-01-25 NOTE — Patient Outreach (Signed)
 Complex Care Management   Visit Note  01/19/2024  Name:  Kelli Mitchell MRN: [id number] DOB: 12/29/1933  Situation: Referral received for Complex Care Management related to arthritis and level of care needs. I obtained verbal consent from Patient.  Visit completed with Patient  on the phone  Background:   Past Medical History:  Diagnosis Date   Gastroesophageal reflux disease with esophagitis    Hyperlipidemia    Hypertension    Measles    Other forms of angina pectoris     Assessment: Patient Reported Symptoms:  Cognitive Cognitive Status: Alert and oriented to person, place, and time, Normal speech and language skills, No symptoms reported Cognitive/Intellectual Conditions Management [RPT]: None reported or documented in medical history or problem list   Health Maintenance Behaviors: Annual physical exam Healing Pattern: Unsure Health Facilitated by: Rest  Neurological Neurological Review of Symptoms: Dizziness (episodes of mild dizziness. Has not worsened or increased in frequency. Encouraged to F/U with PCP.) Neurological Management Strategies: Routine screening Neurological Self-Management Outcome: 4 (good) Neurological Comment: Dizziness has been evaluated by ENT and neurology  HEENT HEENT Symptoms Reported: No symptoms reported      Cardiovascular      Respiratory      Endocrine Endocrine Symptoms Reported: No symptoms reported Is patient diabetic?: No    Gastrointestinal Gastrointestinal Symptoms Reported: No symptoms reported      Genitourinary Genitourinary Symptoms Reported: No symptoms reported    Integumentary Integumentary Symptoms Reported: No symptoms reported    Musculoskeletal Musculoskelatal Symptoms Reviewed: Joint pain Other Musculoskeletal Symptoms: history of mechanical falls. Additional Musculoskeletal Details: right wrist/hand pain. Manages well with brace and voltaren gel Musculoskeletal Management Strategies: Adequate rest, Medication  therapy, Medical device Musculoskeletal Comment: Previously discussed medical alert system. Patient isn't interested at thist ime. Enocuraged to carry cell phone with her at all times. Encouraged fall precautions and advised to follow-up with provider or seek emergency care if necessary after a fall, especailly if she hits her head. Falls in the past year?: Yes Number of falls in past year: 2 or more Was there an injury with Fall?: No Fall Risk Category Calculator: 2 Patient Fall Risk Level: Moderate Fall Risk Patient at Risk for Falls Due to: History of fall(s), Impaired balance/gait Fall risk Follow up: Falls prevention discussed, Falls evaluation completed  Psychosocial Psychosocial Symptoms Reported: No symptoms reported     Quality of Family Relationships: supportive Do you feel physically threatened by others?: No    01/25/2024    PHQ2-9 Depression Screening   Little interest or pleasure in doing things    Feeling down, depressed, or hopeless    PHQ-2 - Total Score    Trouble falling or staying asleep, or sleeping too much    Feeling tired or having little energy    Poor appetite or overeating     Feeling bad about yourself - or that you are a failure or have let yourself or your family down    Trouble concentrating on things, such as reading the newspaper or watching television    Moving or speaking so slowly that other people could have noticed.  Or the opposite - being so fidgety or restless that you have been moving around a lot more than usual    Thoughts that you would be better off dead, or hurting yourself in some way    PHQ2-9 Total Score    If you checked off any problems, how difficult have these problems made it for you to do  your work, take care of things at home, or get along with other people    Depression Interventions/Treatment      There were no vitals filed for this visit.    Medications Reviewed Today     Reviewed by Charlsie Josette SAILOR, RN (Registered  Nurse) on 01/19/24 at 1628  Med List Status: <None>   Medication Order Taking? Sig Documenting Provider Last Dose Status Informant  amLODipine (NORVASC) 10 MG tablet [id number]  Take 10 mg by mouth daily.  Patient not taking: Reported on 01/19/2024   [provider]  Active   amLODipine (NORVASC) 5 MG tablet [id number] Yes Take 5 mg by mouth daily. [provider]  Active   aspirin EC 81 MG tablet [id number] Yes Take 81 mg by mouth daily. [provider]  Active   atorvastatin (LIPITOR) 20 MG tablet [id number] Yes Take 20 mg by mouth daily. [provider]  Active   cetirizine (ZYRTEC) 10 MG tablet [id number] Yes Take 10 mg by mouth daily as needed.  [provider]  Active   Cholecalciferol (VITAMIN D3) 1000 units CAPS [id number]  Take by mouth.  Patient not taking: Reported on 01/19/2024   [provider]  Active   furosemide (LASIX) 20 MG tablet [id number] Yes Take 20 mg by mouth every other day. [provider]  Active   Glucosamine 500 MG CAPS [id number] Yes Take by mouth daily. [provider]  Active   losartan (COZAAR) 50 MG tablet [id number] Yes Take 50 mg by mouth daily. [provider]  Active   metFORMIN (GLUCOPHAGE) 500 MG tablet [id number]  Take 500 mg by mouth daily with breakfast.  Patient not taking: Reported on 01/19/2024   [provider]  Active   Omega-3 Fatty Acids (FISH OIL) 1000 MG CAPS [id number] Yes Take 1 capsule by mouth daily.  [provider]  Active   ranitidine (ZANTAC) 300 MG tablet [id number] Yes Take 300 mg by mouth at bedtime. [provider]  Active   solifenacin (VESICARE) 10 MG tablet [id number] Yes Take 5 mg by mouth daily. [provider]  Active             Recommendation:   Continue Current Plan of Care  Follow Up Plan:   Telephone follow-up 2 weeks  Josette Charlsie, RN, BSN   [facility] RN Care Manager Direct Dial:  [phone]  Fax: [phone]

## 2024-01-26 NOTE — Patient Instructions (Signed)
 Visit Information  Thank you for taking time to visit with me today. Please don't hesitate to contact me if I can be of assistance to you before our next scheduled appointment.  Your next care management appointment is by telephone on 01/09/24.   Please call the care guide team at [phone] if you need to cancel, schedule, or reschedule an appointment.   Please call 911 if you are experiencing a Mental Health or Behavioral Health Crisis or need someone to talk to.  Josette Pellet, RN, BSN [place]  [facility] RN Care Manager Direct Dial: [phone]  Fax: [phone]

## 2024-01-26 NOTE — Patient Instructions (Signed)
 Visit Information  Thank you for taking time to visit with me today. Please don't hesitate to contact me if I can be of assistance to you before our next scheduled appointment.  Your next care management appointment is by telephone on 02/10/24 at 11:30  Please call the care guide team at [phone] if you need to cancel, schedule, or reschedule an appointment.   Please call 911 if you are experiencing a Mental Health or Behavioral Health Crisis or need someone to talk to.  Josette Pellet, RN, BSN [place]  [facility] RN Care Manager Direct Dial: [phone]  Fax: [phone]

## 2024-01-26 NOTE — Patient Outreach (Signed)
 Complex Care Management   Visit Note  01/09/2024  Name:  Kelli Mitchell MRN: [id number] DOB: 12-Dec-1933  Situation: Referral received for Complex Care Management related to arthritis and level of care needs. I obtained verbal consent from Patient.  Visit completed with Patient  on the phone  Background:   Past Medical History:  Diagnosis Date   Gastroesophageal reflux disease with esophagitis    Hyperlipidemia    Hypertension    Measles    Other forms of angina pectoris     Assessment: Patient Reported Symptoms:  Cognitive Cognitive Status: Alert and oriented to person, place, and time, Normal speech and language skills, No symptoms reported Cognitive/Intellectual Conditions Management [RPT]: None reported or documented in medical history or problem list   Health Maintenance Behaviors: Annual physical exam Healing Pattern: Unsure Health Facilitated by: Rest  Neurological Neurological Review of Symptoms: Not assessed    HEENT HEENT Symptoms Reported: Not assessed      Cardiovascular Cardiovascular Symptoms Reported: Not assessed    Respiratory Respiratory Symptoms Reported: Not assesed    Endocrine Endocrine Symptoms Reported: Not assessed    Gastrointestinal Gastrointestinal Symptoms Reported: Not assessed      Genitourinary Genitourinary Symptoms Reported: Not assessed    Integumentary Integumentary Symptoms Reported: Not assessed    Musculoskeletal Musculoskelatal Symptoms Reviewed: Joint pain Other Musculoskeletal Symptoms: history of mechanical falls Additional Musculoskeletal Details: right wrist/hand pain. Manages well with brace and voltaren gel. Musculoskeletal Management Strategies: Adequate rest, Medication therapy, Medical device Musculoskeletal Self-Management Outcome: 4 (good) Musculoskeletal Comment: Previously discussed medical alert system. Patient isn't interested at thist ime. Enocuraged to carry cell phone with her at all times. Encouraged fall  precautions and advised to follow-up with provider or seek emergency care if necessary after a fall, especailly if she hits her head. Referral to LCSW to discuss Personal Care Service application. Falls in the past year?: Yes Number of falls in past year: 2 or more Was there an injury with Fall?: No Fall Risk Category Calculator: 2 Patient Fall Risk Level: Moderate Fall Risk Patient at Risk for Falls Due to: History of fall(s), Impaired balance/gait, Impaired mobility, Other (Comment) (joint pain) Fall risk Follow up: Falls evaluation completed, Falls prevention discussed  Psychosocial Psychosocial Symptoms Reported: Not assessed          01/26/2024    PHQ2-9 Depression Screening   Little interest or pleasure in doing things    Feeling down, depressed, or hopeless    PHQ-2 - Total Score    Trouble falling or staying asleep, or sleeping too much    Feeling tired or having little energy    Poor appetite or overeating     Feeling bad about yourself - or that you are a failure or have let yourself or your family down    Trouble concentrating on things, such as reading the newspaper or watching television    Moving or speaking so slowly that other people could have noticed.  Or the opposite - being so fidgety or restless that you have been moving around a lot more than usual    Thoughts that you would be better off dead, or hurting yourself in some way    PHQ2-9 Total Score    If you checked off any problems, how difficult have these problems made it for you to do your work, take care of things at home, or get along with other people    Depression Interventions/Treatment      There were no vitals  filed for this visit.    Medications Reviewed Today   Medications were not reviewed in this encounter     Recommendation:   Continue Current Plan of Care Talk with LCSW about Personal Care Service application  Follow Up Plan:   Telephone follow up appointment date/time:  02/09/24 at  11:30  Josette Pellet, RN, BSN [place]  [facility] RN Care Manager Direct Dial: [phone]  Fax: [phone]

## 2024-02-01 ENCOUNTER — Other Ambulatory Visit: Admitting: Licensed Clinical Social Worker

## 2024-02-01 NOTE — Patient Outreach (Signed)
 Complex Care Management   Visit Note  02/01/2024  Name:  Kelli Mitchell MRN: [id number] DOB: July 28, 1933  Situation: Referral received for Complex Care Management related to SDOH Barriers:  in home support needs I obtained verbal consent from Patient.  Visit completed with Patient  on the phone  Background:   Past Medical History:  Diagnosis Date   Gastroesophageal reflux disease with esophagitis    Hyperlipidemia    Hypertension    Measles    Other forms of angina pectoris     Assessment: Patient Reported Symptoms:  Cognitive Cognitive Status: Able to follow simple commands, Alert and oriented to person, place, and time, Normal speech and language skills Cognitive/Intellectual Conditions Management [RPT]: None reported or documented in medical history or problem list   Health Maintenance Behaviors: Annual physical exam Healing Pattern: Unsure Health Facilitated by: Rest  Neurological Neurological Review of Symptoms: Dizziness Neurological Management Strategies: Routine screening, Coping strategies Neurological Self-Management Outcome: 4 (good) Neurological Comment: Dizziness has been evaluated by ENT and neurology  HEENT HEENT Symptoms Reported: No symptoms reported HEENT Management Strategies: Routine screening HEENT Self-Management Outcome: 4 (good)    Cardiovascular Cardiovascular Symptoms Reported: Not assessed    Respiratory Respiratory Symptoms Reported: No symptoms reported    Endocrine Endocrine Symptoms Reported: No symptoms reported Is patient diabetic?: No    Gastrointestinal Gastrointestinal Symptoms Reported: No symptoms reported      Genitourinary Genitourinary Symptoms Reported: No symptoms reported    Integumentary Integumentary Symptoms Reported: No symptoms reported    Musculoskeletal Musculoskelatal Symptoms Reviewed: Joint pain Musculoskeletal Management Strategies: Adequate rest, Medication therapy, Medical device Musculoskeletal  Self-Management Outcome: 4 (good) Musculoskeletal Comment: Previously discussed medical alert system. Patient isn't interested at thist ime. Enocuraged to carry cell phone with her at all times. Encouraged fall precautions and advised to follow-up with provider or seek emergency care if necessary after a fall, especailly if she hits her head. Falls in the past year?: Yes Number of falls in past year: 2 or more Was there an injury with Fall?: No Fall Risk Category Calculator: 2 Patient Fall Risk Level: Moderate Fall Risk    Psychosocial Psychosocial Symptoms Reported: No symptoms reported Behavioral Management Strategies: Coping strategies, Support system Behavioral Health Self-Management Outcome: 4 (good) Major Change/Loss/Stressor/Fears (CP): Denies Techniques to Cope with Loss/Stress/Change: Spiritual practice(s), Diversional activities Quality of Family Relationships: supportive Do you feel physically threatened by others?: No    02/01/2024    PHQ2-9 Depression Screening   Little interest or pleasure in doing things Not at all  Feeling down, depressed, or hopeless Not at all  PHQ-2 - Total Score 0  Trouble falling or staying asleep, or sleeping too much    Feeling tired or having little energy    Poor appetite or overeating     Feeling bad about yourself - or that you are a failure or have let yourself or your family down    Trouble concentrating on things, such as reading the newspaper or watching television    Moving or speaking so slowly that other people could have noticed.  Or the opposite - being so fidgety or restless that you have been moving around a lot more than usual    Thoughts that you would be better off dead, or hurting yourself in some way    PHQ2-9 Total Score    If you checked off any problems, how difficult have these problems made it for you to do your work, take care of things at home,  or get along with other people    Depression Interventions/Treatment      SDOH Interventions    Flowsheet Row Patient Outreach Telephone from 02/01/2024 in [place] POPULATION HEALTH DEPARTMENT Patient Outreach Telephone from 01/19/2024 in [place] POPULATION HEALTH DEPARTMENT Patient Outreach Telephone from 11/29/2023 in [place] POPULATION HEALTH DEPARTMENT  SDOH Interventions     Food Insecurity Interventions Intervention Not Indicated Intervention Not Indicated Intervention Not Indicated  Housing Interventions Intervention Not Indicated Intervention Not Indicated Intervention Not Indicated  Transportation Interventions Patient Resources (Friends/Family) Intervention Not Indicated, Patient Resources (Friends/Family) Intervention Not Indicated, Patient Resources (Friends/Family)  Utilities Interventions -- -- Intervention Not Indicated  Alcohol Usage Interventions -- -- Intervention Not Indicated (Score <7)  Physical Activity Interventions -- -- Intervention Not Indicated  Stress Interventions Intervention Not Indicated -- Intervention Not Indicated  Social Connections Interventions -- -- Intervention Not Indicated  Health Literacy Interventions -- -- Intervention Not Indicated   There were no vitals filed for this visit.    Medications Reviewed Today     Reviewed by Merlynn Lyle CROME, LCSW (Social Worker) on 02/01/24 at 1034  Med List Status: <None>   Medication Order Taking? Sig Documenting Provider Last Dose Status Informant  amLODipine (NORVASC) 10 MG tablet [id number]  Take 10 mg by mouth daily.  Patient not taking: Reported on 01/19/2024   [provider]  Active   amLODipine (NORVASC) 5 MG tablet [id number]  Take 5 mg by mouth daily. [provider]  Active   aspirin EC 81 MG tablet [id number]  Take 81 mg by mouth daily. [provider]  Active   atorvastatin (LIPITOR) 20 MG tablet [id number]  Take 20 mg by mouth daily. [provider]  Active   cetirizine (ZYRTEC) 10 MG tablet [id number]  Take 10 mg by mouth  daily as needed.  [provider]  Active   Cholecalciferol (VITAMIN D3) 1000 units CAPS [id number]  Take by mouth.  Patient not taking: Reported on 01/19/2024   [provider]  Active   furosemide (LASIX) 20 MG tablet [id number]  Take 20 mg by mouth every other day. [provider]  Active   Glucosamine 500 MG CAPS [id number]  Take by mouth daily. [provider]  Active   losartan (COZAAR) 50 MG tablet [id number]  Take 50 mg by mouth daily. [provider]  Active   metFORMIN (GLUCOPHAGE) 500 MG tablet [id number]  Take 500 mg by mouth daily with breakfast.  Patient not taking: Reported on 01/19/2024   [provider]  Active   Omega-3 Fatty Acids (FISH OIL) 1000 MG CAPS [id number]  Take 1 capsule by mouth daily.  [provider]  Active   ranitidine (ZANTAC) 300 MG tablet [id number]  Take 300 mg by mouth at bedtime. [provider]  Active   solifenacin (VESICARE) 10 MG tablet [id number]  Take 5 mg by mouth daily. [provider]  Active             Recommendation:   PCP Follow-up Continue Current Plan of Care  Follow Up Plan:   Telephone follow-up in 1 month Referral to BSW  Lyle Merlynn, BSW, MSW, [employer] Licensed Clinical Social Worker [employer]   [facility] [email or web address] Direct Dial: [phone]

## 2024-02-01 NOTE — Patient Instructions (Signed)
 Visit Information  Thank you for taking time to visit with me today. Please don't hesitate to contact me if I can be of assistance to you before our next scheduled appointment.  Our next appointment is by telephone on 02/03/24 at 10 am Please call the care guide team at [phone] if you need to cancel or reschedule your appointment.   Please call the Suicide and Crisis Lifeline: 988 call the [place]  National Suicide Prevention Lifeline: 818-578-3916 or TTY: 947 723 4546 TTY 620-740-5416) to talk to a trained counselor call 1-800-273-TALK (toll free, 24 hour hotline) go to [facility] [address], [place] [phone]) call the [facility] Crisis Line: (818)[phone] call 911 if you are experiencing a Mental Health or Behavioral Health Crisis or need someone to talk to.  Patient verbalized understanding of Care plan and visit instructions communicated this visit  Lyle Rung, BSW, MSW, LCSW Licensed Clinical Social Worker [employer]   [email or web address] Direct Dial: [phone]

## 2024-02-03 ENCOUNTER — Other Ambulatory Visit

## 2024-02-03 ENCOUNTER — Telehealth: Payer: Self-pay

## 2024-02-03 NOTE — Patient Outreach (Signed)
 Patient called to reschedule appointment. BSW rescheduled appointment for 02/05/25 at 3pm.  [name], BSW [place]  [facility] Social Worker, [facility] [phone]

## 2024-02-03 NOTE — Patient Instructions (Signed)
 Kelli Mitchell - I am sorry I was unable to reach you today for our scheduled appointment. I work with Trudy Vaughn FALCON, MD and am calling to support your healthcare needs. Please contact me at [phone] at your earliest convenience. I look forward to speaking with you soon.   Thank you,  [name], BSW [place]  [facility] Social Worker, [employer] [phone]

## 2024-02-06 ENCOUNTER — Other Ambulatory Visit

## 2024-02-06 NOTE — Patient Instructions (Signed)
 Visit Information  Thank you for taking time to visit with me today. Please don't hesitate to contact me if I can be of assistance to you before our next scheduled appointment.  Our next appointment is by telephone on 02/21/24 at 11am Please call the care guide team at [phone] if you need to cancel or reschedule your appointment.   Following is a copy of your care plan:   Goals Addressed             This Visit's Progress    BSW Goal       Current SDOH Barriers:  Lacks knowledge of community resource: Patient needs resources on PCS and Medicaid enrollment  Interventions: Patient interviewed and appropriate screenings performed Referred patient to community resources  Provided patient with information about [place] ADTS and DSS Discussed plans with patient for ongoing follow up and provided patient with direct contact number Advised patient to reach out to [facility] DSS            Please call the Suicide and Crisis Lifeline: 988 call the [place]  National Suicide Prevention Lifeline: 7785277866 or TTY: 4376382460 TTY (239)069-8937) to talk to a trained counselor call 1-800-273-TALK (toll free, 24 hour hotline) call the [facility]: 250-686-1300 call 911 if you are experiencing a Mental Health or Behavioral Health Crisis or need someone to talk to.  Patient verbalized understanding of Care plan and visit instructions communicated this visit [name], BSW [facility]  [facility] Institute Social Worker, [employer] [facility] [phone]

## 2024-02-06 NOTE — Patient Outreach (Signed)
 Social Drivers of Health  Community Resource and Care Coordination Visit Note   02/06/2024  Name: Kelli Mitchell MRN: [id number] DOB:11-Oct-1933  Situation: Referral received for [facility] needs assessment and assistance related to Financial Strain  , Medicaid enrollment, and PCS. I obtained verbal consent from Patient.  Visit completed with Patient on the phone.   Background:     BSW outreached patient following a referral to assist with Medicaid enrollment and placement on the waiting list for free in-home aide services in [place]. During the outreach, BSW contacted ADTS to inquire about home aide services and left a voicemail; BSW is currently awaiting a return call. BSW advised the patient to contact or visit [facility] to complete her Medicaid enrollment, as the patient reported receiving a letter indicating she may be eligible for Medicaid coverage. Patient verbalized understanding of the information provided. BSW will follow up with the patient on 02/21/2024 at 11:00 AM to discuss updates regarding Medicaid enrollment and home aide services.   Assessment:   Goals Addressed             This Visit's Progress    BSW Goal       Current SDOH Barriers:  Lacks knowledge of community resource: Patient needs resources on PCS and Medicaid enrollment  Interventions: Patient interviewed and appropriate screenings performed Referred patient to community resources  Provided patient with information about [place] ADTS and DSS Discussed plans with patient for ongoing follow up and provided patient with direct contact number Advised patient to reach out to [facility] DSS            Recommendation:   attend all scheduled provider appointments call for transportation assistance at least one week before appointments ask for help if you don't understand your health insurance benefits  Follow Up Plan:   Telephone follow-up 02/21/24 at  11am  [name], BSW [place]  [facility] Social Worker, [employer] [facility] [phone]

## 2024-02-10 ENCOUNTER — Other Ambulatory Visit: Payer: Self-pay | Admitting: *Deleted

## 2024-02-10 ENCOUNTER — Encounter: Payer: Self-pay | Admitting: *Deleted

## 2024-02-21 ENCOUNTER — Other Ambulatory Visit: Payer: Self-pay

## 2024-02-21 NOTE — Patient Instructions (Signed)
 Kelli Mitchell - I am sorry I was unable to reach you today for our scheduled appointment. I work with Trudy Vaughn FALCON, MD and am calling to support your healthcare needs. Please contact me at [phone] at your earliest convenience. I look forward to speaking with you soon.   Thank you,  [name], BSW [place]  [facility] Social Worker, [employer] [phone]

## 2024-02-24 ENCOUNTER — Other Ambulatory Visit: Payer: Self-pay

## 2024-02-24 NOTE — Patient Outreach (Signed)
 [facility]  Community Resource and Care Coordination Visit Note   02/24/2024  Name: Kelli Mitchell MRN: [id number] DOB:July 29, 1933  Situation: Referral received for [facility] needs assessment and assistance related to Personal care services, and medicaid enrollment. I obtained verbal consent from Patient.  Visit completed with Patient on the phone.   Background:      Assessment:  BSW conducted outreach with patient today to follow up on previously provided resources and to discuss her recent visit with Social Services regarding Medicaid enrollment. Patient stated that she went to [facility] and was informed that she is not eligible for Medicaid due to income guidelines. Patient confirmed receipt of the resources provided and stated that she will continue to review them and reach out if additional assistance is needed. Patient reported that her daughter will continue to assist her with navigating resources and support needs. At this time, no further needs were identified. BSW will close the case.   Goals Addressed             This Visit's Progress    COMPLETED: BSW Goal       Current SDOH Barriers:  Lacks knowledge of community resource: Patient needs resources on PCS and Medicaid enrollment  Interventions: Patient interviewed and appropriate screenings performed Referred patient to community resources  Provided patient with information about [place] ADTS and DSS Discussed plans with patient for ongoing follow up and provided patient with direct contact number Advised patient to reach out to [facility] DSS            Recommendation:   attend all scheduled provider appointments call for transportation assistance at least one week before appointments ask for help if you don't understand your health insurance benefits  Follow Up Plan:   Patient has achieved all patient stated goals. [employer] will be closed. Patient has been  provided contact information should new needs arise.   [name], BSW [place]  [facility] Social Worker, [facility] [phone]

## 2024-02-24 NOTE — Patient Instructions (Signed)
   Visit Information  Thank you for taking time to visit with me today. Please don't hesitate to contact me if I can be of assistance to you before our next scheduled appointment.  Your next care management appointment is no further scheduled appointments.    Patient has met all care management goals. Care Management case will be closed. Patient has been provided contact information should new needs arise.   Please call the care guide team at [phone] if you need to cancel, schedule, or reschedule an appointment.   Please call the Suicide and Crisis Lifeline: 988 call the [place]  National Suicide Prevention Lifeline: (860)582-1212 or TTY: (563)477-4864 TTY 864 223 0578) to talk to a trained counselor call 1-800-273-TALK (toll free, 24 hour hotline) call the [facility]: [phone] call 911 if you are experiencing a Mental Health or Behavioral Health Crisis or need someone to talk to.  Haven Lion, BSW [place]  [facility] Social Worker, [facility] [phone]

## 2024-03-09 ENCOUNTER — Other Ambulatory Visit: Payer: Self-pay | Admitting: *Deleted

## 2024-03-16 ENCOUNTER — Telehealth: Admitting: *Deleted

## 2024-03-27 ENCOUNTER — Telehealth: Admitting: *Deleted

## 2024-03-29 ENCOUNTER — Telehealth
